# Patient Record
Sex: Female | Born: 1975 | Race: White | Hispanic: No | Marital: Married | Smoking: Never smoker
Health system: Southern US, Community
[De-identification: ages and names within clinical notes are randomized; demographics above are authoritative.]

## PROBLEM LIST (undated history)

## (undated) DIAGNOSIS — E669 Obesity, unspecified: Secondary | ICD-10-CM

## (undated) DIAGNOSIS — N809 Endometriosis, unspecified: Secondary | ICD-10-CM

## (undated) DIAGNOSIS — E559 Vitamin D deficiency, unspecified: Secondary | ICD-10-CM

## (undated) DIAGNOSIS — Z464 Encounter for fitting and adjustment of orthodontic device: Secondary | ICD-10-CM

## (undated) DIAGNOSIS — IMO0001 Reserved for inherently not codable concepts without codable children: Secondary | ICD-10-CM

## (undated) HISTORY — PX: ABDOMINAL HYSTERECTOMY: SHX81

## (undated) HISTORY — DX: Endometriosis, unspecified: N80.9

## (undated) HISTORY — DX: Vitamin D deficiency, unspecified: E55.9

## (undated) HISTORY — DX: Obesity, unspecified: E66.9

---

## 2002-06-30 HISTORY — PX: AUGMENTATION MAMMAPLASTY: SUR837

## 2002-06-30 HISTORY — PX: PLACEMENT OF BREAST IMPLANTS: SHX6334

## 2005-02-06 ENCOUNTER — Emergency Department: Payer: Self-pay | Admitting: Unknown Physician Specialty

## 2005-02-06 ENCOUNTER — Other Ambulatory Visit: Payer: Self-pay

## 2005-04-04 ENCOUNTER — Emergency Department: Payer: Self-pay | Admitting: Emergency Medicine

## 2006-04-27 ENCOUNTER — Emergency Department: Payer: Self-pay | Admitting: Emergency Medicine

## 2006-09-07 ENCOUNTER — Ambulatory Visit: Payer: Self-pay | Admitting: Obstetrics & Gynecology

## 2006-09-10 ENCOUNTER — Ambulatory Visit: Payer: Self-pay | Admitting: Obstetrics & Gynecology

## 2009-09-10 ENCOUNTER — Ambulatory Visit: Payer: Self-pay | Admitting: Family Medicine

## 2010-04-05 ENCOUNTER — Ambulatory Visit: Payer: Self-pay | Admitting: Internal Medicine

## 2013-02-22 ENCOUNTER — Ambulatory Visit: Payer: Self-pay | Admitting: Obstetrics and Gynecology

## 2013-02-22 ENCOUNTER — Encounter: Payer: Self-pay | Admitting: *Deleted

## 2013-02-28 HISTORY — PX: BREAST BIOPSY: SHX20

## 2013-03-09 ENCOUNTER — Encounter: Payer: Self-pay | Admitting: General Surgery

## 2013-03-09 ENCOUNTER — Ambulatory Visit (INDEPENDENT_AMBULATORY_CARE_PROVIDER_SITE_OTHER): Payer: 59 | Admitting: General Surgery

## 2013-03-09 VITALS — BP 124/72 | HR 76 | Resp 12 | Ht 68.0 in | Wt 202.0 lb

## 2013-03-09 DIAGNOSIS — N63 Unspecified lump in unspecified breast: Secondary | ICD-10-CM

## 2013-03-09 NOTE — Progress Notes (Signed)
Patient ID: Linda Jimenez, female   DOB: August 30, 1975, 37 y.o.   MRN: [id number]  Chief Complaint  Patient presents with  . Other    mammgramand left breast mass    HPI Linda Jimenez is a 37 y.o. female who presents for a breast evaluation. The most recent mammogram and ultrasound was done on 02/22/13. Patient does perform regular self breast checks and gets regular mammograms done.  Patient noticed an lump in her left breast about two months ago.She states it is not getting bigger and is not painful. No previous history of breast nodularity. The patient is accompanied by her husband.  HPI  History reviewed. No pertinent past medical history.  Past Surgical History  Procedure Laterality Date  . Abdominal hysterectomy    . Placement of breast implants  2004    Dr. Daphene Calamity    No family history on file.  Social History History  Substance Use Topics  . Smoking status: Never Smoker   . Smokeless tobacco: Never Used  . Alcohol Use: Yes    No Known Allergies  No current outpatient prescriptions on file.   No current facility-administered medications for this visit.    Review of Systems Review of Systems  Constitutional: Negative.   Respiratory: Negative.   Cardiovascular: Negative.     Blood pressure 124/72, pulse 76, resp. rate 12, height 5\' 8"  (1.727 m), weight 202 lb (91.627 kg).  Physical Exam Physical Exam  Constitutional: She is oriented to person, place, and time. She appears well-developed and well-nourished.  Cardiovascular: Normal rate, regular rhythm and normal heart sounds.   Pulmonary/Chest: Breath sounds normal. Right breast exhibits no inverted nipple, no mass, no nipple discharge, no skin change and no tenderness. Left breast exhibits mass. Left breast exhibits no inverted nipple, no nipple discharge, no skin change and no tenderness.  Lymphadenopathy:    She has no cervical adenopathy.    She has no axillary adenopathy.  Neurological: She is alert and  oriented to person, place, and time.  Skin: Skin is warm and dry.    Data Reviewed PCP notes.  Bilateral mammograms dated 02/22/2013 showed retropectoral implants. Partially circumscribed oval masses noted in the left upper inner quadrant corresponding to the palpable findings. Ultrasound examination showed a 1.3 x 2.3 x 2.7 cm mass at the 10:00 position. BI-RAD-3.  Ultrasound at the 11:00 position 8 cm from the nipple confirm the hypoechoic mass measuring 1.5 x 2.3 x 2.6 cm. The patient desired core biopsy to confirm the clinical impression of a fibroadenoma.  10 cc of 0.5% Xylocaine with 0.25% Marcaine with 1-200,000 units of epinephrine was utilized well tolerated. ChloraPrep applied to the skin. Under ultrasound guidance a 14-gauge Finesse device was advanced through the lesion. Multiple core samples were taken at 2 locations. Scant bleeding was noted. The subpectoral implant was in the field of view during needle passage and biopsy, and was more than a centimeter away from the biopsy needle. The skin defect was closed with benzoin and Steri-Strip after placement of a postbiopsy clip. The procedure was well tolerated. Written instructions were provided for wound care.  Assessment    Fibroadenoma of the left breast, asymptomatic.    Plan    If her biopsies benign in the area remains mentally and physically asymptomatic we'll plan for a follow up office ultrasound 6 months.       Linda Jimenez 03/10/2013, 7:59 AM

## 2013-03-09 NOTE — Patient Instructions (Addendum)

## 2013-03-10 ENCOUNTER — Telehealth: Payer: Self-pay | Admitting: *Deleted

## 2013-03-10 ENCOUNTER — Encounter: Payer: Self-pay | Admitting: General Surgery

## 2013-03-10 LAB — PATHOLOGY

## 2013-03-10 NOTE — Telephone Encounter (Signed)
Notified patient as instructed, no cancer (fibroadenoma) per Dr. Lemar Livings, patient pleased. Discussed follow-up appointments, next week with nurse, patient agrees

## 2013-03-15 ENCOUNTER — Encounter: Payer: Self-pay | Admitting: General Surgery

## 2013-03-16 ENCOUNTER — Ambulatory Visit (INDEPENDENT_AMBULATORY_CARE_PROVIDER_SITE_OTHER): Payer: 59 | Admitting: *Deleted

## 2013-03-16 DIAGNOSIS — N63 Unspecified lump in unspecified breast: Secondary | ICD-10-CM

## 2013-03-16 NOTE — Patient Instructions (Addendum)
Heating pad as needed for comfort Continue self breast exams. Call office for any new breast issues or concerns.

## 2013-03-16 NOTE — Progress Notes (Signed)
Patient here today for follow up post left breast biopsy.  No dressing, steristrip in place and aware it may come off in one week.  Minimal bruising noted.   Aware of pathology. Follow up as scheduled.

## 2013-09-05 ENCOUNTER — Encounter: Payer: Self-pay | Admitting: General Surgery

## 2013-09-05 ENCOUNTER — Ambulatory Visit (INDEPENDENT_AMBULATORY_CARE_PROVIDER_SITE_OTHER): Payer: 59 | Admitting: General Surgery

## 2013-09-05 ENCOUNTER — Other Ambulatory Visit: Payer: 59

## 2013-09-05 VITALS — BP 122/68 | HR 77 | Resp 13 | Ht 68.0 in | Wt 203.0 lb

## 2013-09-05 DIAGNOSIS — N63 Unspecified lump in unspecified breast: Secondary | ICD-10-CM | POA: Insufficient documentation

## 2013-09-05 DIAGNOSIS — D249 Benign neoplasm of unspecified breast: Secondary | ICD-10-CM

## 2013-09-05 DIAGNOSIS — D242 Benign neoplasm of left breast: Secondary | ICD-10-CM | POA: Insufficient documentation

## 2013-09-05 NOTE — Patient Instructions (Signed)
Continue self breast exams. Call office for any new breast issues or concerns. 

## 2013-09-05 NOTE — Progress Notes (Signed)
Patient ID: Linda Jimenez, female   DOB: [birth year], 38 y.o.   MRN: [id number]  Chief Complaint  Patient presents with  . Follow-up    breast ultrasound    HPI Linda Jimenez is a 38 y.o. female here today for a left breast ultrasound to follow up from a biopsy done 6 months ago . Patient states she is doing well. She denies any new breast problems.  HPI  No past medical history on file.  Past Surgical History  Procedure Laterality Date  . Abdominal hysterectomy    . Placement of breast implants  2004    Dr. Henreitta Leber    No family history on file.  Social History History  Substance Use Topics  . Smoking status: Never Smoker   . Smokeless tobacco: Never Used  . Alcohol Use: Yes    No Known Allergies  Current Outpatient Prescriptions  Medication Sig Dispense Refill  . Multiple Vitamin (MULTIVITAMINS PO) Take by mouth.       No current facility-administered medications for this visit.    Review of Systems Review of Systems  Constitutional: Negative.   Respiratory: Negative.   Cardiovascular: Negative.     Blood pressure 122/68, pulse 77, resp. rate 13, height 5\' 8"  (1.727 m), weight 203 lb (92.08 kg).  Physical Exam Physical Exam  Constitutional: She is oriented to person, place, and time. She appears well-developed and well-nourished.  Neck: Neck supple.  Cardiovascular: Normal rate, regular rhythm and normal heart sounds.   Pulmonary/Chest: Effort normal and breath sounds normal. Right breast exhibits no inverted nipple, no mass, no nipple discharge, no skin change and no tenderness. Left breast exhibits no inverted nipple, no nipple discharge, no skin change and no tenderness. Mass: soft 2.5 cm area at 11 o'clock about 8 cm from the nipple One.    Lymphadenopathy:    She has no cervical adenopathy.    She has no axillary adenopathy.  Neurological: She is alert and oriented to person, place, and time.    Data Reviewed Left breast biopsy completed March 09, 2013 showed a fibroadenoma. The left breast mass at that time measured 1.8 2 x 2 0.33 x 2.62 cm.  Ultrasound examination of the left breast mass at the 11 o'clock position, 8 cm from the nipple shows a well-circumscribed homogeneous smoothly marginated 1.5 x 2.3 x 2.7 cm mass resting on the pectoralis fascia. No significant interval change.  Assessment    Fibroadenoma left breast.     Plan    The area remains asymptomatic, although the patient appreciates that during her regular self-examination. She has no full pain or tenderness in this area. Options for management were reviewed: 1) continued observation with a follow up ultrasound in 6 months versus 2) referral for cryoablation versus 3) surgical excision. At this time, the patient is comfortable with observation.  Will arrange for a repeat ultrasound in 6 months. If stable will have a final exam with a repeat mammogram in 18 months.        Linda Jimenez 09/05/2013, 7:16 PM

## 2014-03-08 ENCOUNTER — Ambulatory Visit: Payer: Self-pay | Admitting: General Surgery

## 2014-03-29 ENCOUNTER — Ambulatory Visit: Payer: 59

## 2014-03-29 ENCOUNTER — Ambulatory Visit (INDEPENDENT_AMBULATORY_CARE_PROVIDER_SITE_OTHER): Payer: 59 | Admitting: General Surgery

## 2014-03-29 ENCOUNTER — Encounter: Payer: Self-pay | Admitting: General Surgery

## 2014-03-29 VITALS — BP 128/74 | HR 78 | Resp 12 | Ht 68.0 in

## 2014-03-29 DIAGNOSIS — N6320 Unspecified lump in the left breast, unspecified quadrant: Secondary | ICD-10-CM

## 2014-03-29 DIAGNOSIS — D242 Benign neoplasm of left breast: Secondary | ICD-10-CM

## 2014-03-29 DIAGNOSIS — N63 Unspecified lump in unspecified breast: Secondary | ICD-10-CM

## 2014-03-29 DIAGNOSIS — D249 Benign neoplasm of unspecified breast: Secondary | ICD-10-CM

## 2014-03-29 NOTE — Progress Notes (Signed)
Patient ID: Linda Jimenez, female   DOB: 1975/07/17, 38 y.o.   MRN: [id number]  Chief Complaint  Patient presents with  . Follow-up    left breast ultrasound    HPI Linda Jimenez is a 38 y.o. female here today for a left breast ultrasound. Patient states the lump is still there, no pain and the patient has noted no change in size. HPI  No past medical history on file.  Past Surgical History  Procedure Laterality Date  . Abdominal hysterectomy    . Placement of breast implants  2004    Dr. Cathie Hoops  . Breast biopsy Left September 2014    Left breast, 11:00 core biopsy Fibroadenoma    No family history on file.  Social History History  Substance Use Topics  . Smoking status: Never Smoker   . Smokeless tobacco: Never Used  . Alcohol Use: Yes    No Known Allergies  Current Outpatient Prescriptions  Medication Sig Dispense Refill  . Multiple Vitamin (MULTIVITAMINS PO) Take by mouth.       No current facility-administered medications for this visit.    Review of Systems Review of Systems  Constitutional: Negative.   Respiratory: Negative.   Cardiovascular: Negative.     Blood pressure 128/74, pulse 78, resp. rate 12, height 5\' 8"  (1.727 m).  Physical Exam Physical Exam  Constitutional: She is oriented to person, place, and time. She appears well-developed and well-nourished.  Eyes: Conjunctivae are normal. No scleral icterus.  Neck: Neck supple.  Cardiovascular: Normal rate, regular rhythm and normal heart sounds.   Pulmonary/Chest: Effort normal and breath sounds normal. Right breast exhibits no inverted nipple, no mass, no nipple discharge, no skin change and no tenderness. Left breast exhibits mass ( upper outer quadrant , no change since last visit.). Left breast exhibits no inverted nipple, no nipple discharge, no skin change and no tenderness.    Abdominal: Soft. Normal appearance and bowel sounds are normal.  Lymphadenopathy:    She has no cervical  adenopathy.    She has no axillary adenopathy.  Neurological: She is alert and oriented to person, place, and time.  Skin: Skin is warm and dry.    Data Reviewed Left breast ultrasound today shows a well demarcated softly lobulated hypoechoic mass with posterior acoustic enhancement in the 11 o'clock position 6-7 cm from the nipple. This measures 1.73 x 2.39 by 2.91 cm. BI-RAD-2.  In March 2015 this measured 1.5 x 2.3 x 2.8 cm. In September 2014 the area measured 1.8 x 2.3 x 2.6 cm.  Assessment    Fibroadenoma left breast, asymptomatic, minimal interval change.     Plan    The area remains asymptomatic and has shown essentially no change over the last year.  We'll plan for a followup mammogram and clinical exam in one year.      PCP: Hubbard Hartshorn 03/29/2014, 9:46 PM

## 2014-03-29 NOTE — Patient Instructions (Addendum)
Patient to return in one year bilateral screening mammogram .Continue self breast exams. Call office for any new breast issues or concerns.

## 2014-05-01 ENCOUNTER — Encounter: Payer: Self-pay | Admitting: General Surgery

## 2014-10-27 LAB — HEMOGLOBIN A1C: Hgb A1c MFr Bld: 5.5 % (ref 4.0–6.0)

## 2014-10-27 LAB — LIPID PANEL
Cholesterol: 189 mg/dL (ref 0–200)
HDL: 72 mg/dL — AB (ref 35–70)
LDL CALC: 97 mg/dL
Triglycerides: 101 mg/dL (ref 40–160)

## 2014-10-27 LAB — TSH: TSH: 3.43 u[IU]/mL (ref 0.41–5.90)

## 2014-10-27 LAB — BASIC METABOLIC PANEL: Glucose: 93 mg/dL

## 2014-12-01 ENCOUNTER — Encounter: Payer: Self-pay | Admitting: *Deleted

## 2014-12-04 ENCOUNTER — Ambulatory Visit (INDEPENDENT_AMBULATORY_CARE_PROVIDER_SITE_OTHER): Payer: 59 | Admitting: *Deleted

## 2014-12-04 VITALS — BP 150/84 | HR 85 | Ht 68.0 in | Wt 214.6 lb

## 2014-12-04 DIAGNOSIS — E669 Obesity, unspecified: Secondary | ICD-10-CM | POA: Diagnosis not present

## 2014-12-04 MED ORDER — CYANOCOBALAMIN 1000 MCG/ML IJ SOLN
1000.0000 ug | Freq: Once | INTRAMUSCULAR | Status: AC
  Administered 2014-12-04: 1000 ug via INTRAMUSCULAR

## 2014-12-04 NOTE — Progress Notes (Signed)
Patient ID: Linda Jimenez, female   DOB: Jun 25, 1976, 39 y.o.   MRN: [id number] PT IS HERE FOR WT, BP CHECK, B-12 INJ DENIES ANY SIDE EFFECTS FROM PHENTERMINE SHE IS DOING WELL AND HAPPY WITH HER WT LOSS

## 2014-12-25 ENCOUNTER — Encounter: Payer: Self-pay | Admitting: *Deleted

## 2015-01-02 ENCOUNTER — Ambulatory Visit (INDEPENDENT_AMBULATORY_CARE_PROVIDER_SITE_OTHER): Payer: 59 | Admitting: *Deleted

## 2015-01-02 VITALS — BP 151/90 | HR 86 | Ht 68.0 in | Wt 211.6 lb

## 2015-01-02 DIAGNOSIS — E669 Obesity, unspecified: Secondary | ICD-10-CM | POA: Diagnosis not present

## 2015-01-02 MED ORDER — CYANOCOBALAMIN 1000 MCG/ML IJ SOLN
1000.0000 ug | Freq: Once | INTRAMUSCULAR | Status: AC
  Administered 2015-01-02: 1000 ug via INTRAMUSCULAR

## 2015-01-02 NOTE — Progress Notes (Cosign Needed)
Pt is here for wt, bp check, b-12 inj Denies any complaints

## 2015-01-16 ENCOUNTER — Other Ambulatory Visit: Payer: Self-pay | Admitting: Obstetrics and Gynecology

## 2015-01-16 ENCOUNTER — Telehealth: Payer: Self-pay | Admitting: Obstetrics and Gynecology

## 2015-01-16 MED ORDER — CEFDINIR 300 MG PO CAPS
300.0000 mg | ORAL_CAPSULE | Freq: Two times a day (BID) | ORAL | Status: DC
Start: 2015-01-16 — End: 2016-07-29

## 2015-01-16 NOTE — Telephone Encounter (Signed)
PT HAS SINUS INFECTION AND WANTS SOMETHING CALLED IN PT WANTS YOU TO CALL HER.

## 2015-01-16 NOTE — Telephone Encounter (Signed)
pls advise

## 2015-01-17 ENCOUNTER — Other Ambulatory Visit: Payer: Self-pay

## 2015-01-17 DIAGNOSIS — Z1231 Encounter for screening mammogram for malignant neoplasm of breast: Secondary | ICD-10-CM

## 2015-01-30 ENCOUNTER — Ambulatory Visit (INDEPENDENT_AMBULATORY_CARE_PROVIDER_SITE_OTHER): Payer: 59 | Admitting: Obstetrics and Gynecology

## 2015-01-30 VITALS — BP 136/87 | HR 90 | Ht 68.0 in | Wt 212.5 lb

## 2015-01-30 DIAGNOSIS — E669 Obesity, unspecified: Secondary | ICD-10-CM

## 2015-01-30 MED ORDER — CYANOCOBALAMIN 1000 MCG/ML IJ SOLN
1000.0000 ug | Freq: Once | INTRAMUSCULAR | Status: AC
  Administered 2015-01-30: 1000 ug via INTRAMUSCULAR

## 2015-01-30 NOTE — Progress Notes (Signed)
Patient ID: Linda Jimenez, female   DOB: 06/12/1976, 39 y.o.   MRN: [id number] Pt having no adverse side effects of phentermine. Weight gain of 1 lb and pt thinks this is due to not being able drink water at her new job at [employer]. They are not allowed anything to drink except on breaks (if she gets one). Pt would like to know if a note allowing her to have the water at all times.

## 2015-02-27 ENCOUNTER — Encounter: Payer: Self-pay | Admitting: Obstetrics and Gynecology

## 2015-02-27 ENCOUNTER — Telehealth: Payer: Self-pay | Admitting: *Deleted

## 2015-02-27 ENCOUNTER — Ambulatory Visit (INDEPENDENT_AMBULATORY_CARE_PROVIDER_SITE_OTHER): Payer: 59 | Admitting: Obstetrics and Gynecology

## 2015-02-27 VITALS — BP 147/84 | HR 80 | Ht 68.0 in | Wt 216.1 lb

## 2015-02-27 DIAGNOSIS — E669 Obesity, unspecified: Secondary | ICD-10-CM | POA: Diagnosis not present

## 2015-02-27 MED ORDER — PHENTERMINE HCL 37.5 MG PO CAPS: 37.5000 mg | ORAL_CAPSULE | ORAL | Status: DC

## 2015-02-27 NOTE — Telephone Encounter (Signed)
Needs note faxed to work allowing pt to drink water in her area of working [employer]

## 2015-02-27 NOTE — Progress Notes (Signed)
Patient ID: Linda Jimenez, female   DOB: 08-05-1975, 39 y.o.   MRN: [id number]  SUBJECTIVE:  39 y.o. here for follow-up weight loss visit, previously seen 4 weeks ago. Denies any concerns and feels like medication is working well, and has been off of it x 4 weeks. Only complaint is her work will not let her have water at her work station- will give note.  OBJECTIVE:  BP 147/84 mmHg  Pulse 80  Ht 5\' 8"  (1.727 m)  Wt 216 lb 1.6 oz (98.022 kg)  BMI 32.87 kg/m2  LMP  (LMP Unknown)  Body mass index is 32.87 kg/(m^2). Patient appears well. ASSESSMENT:  Obesity- responding well to weight loss plan PLAN:  To continue with current medications. B12 1023mcg/ml injection given RTC in 4 weeks as planned  Gennie Alma, CNM

## 2015-03-12 ENCOUNTER — Ambulatory Visit
Admission: AD | Admit: 2015-03-12 | Discharge: 2015-03-12 | Disposition: A | Discharge status: Home / Self Care | Payer: Managed Care, Other (non HMO) | Source: Ambulatory Visit | Attending: Family Medicine | Admitting: Family Medicine

## 2015-03-12 ENCOUNTER — Telehealth: Payer: Self-pay | Admitting: Obstetrics and Gynecology

## 2015-03-12 ENCOUNTER — Ambulatory Visit
Admission: EM | Admit: 2015-03-12 | Discharge: 2015-03-12 | Disposition: A | Discharge status: Home / Self Care | Payer: Managed Care, Other (non HMO) | Attending: Family Medicine | Admitting: Family Medicine

## 2015-03-12 DIAGNOSIS — R1030 Lower abdominal pain, unspecified: Secondary | ICD-10-CM | POA: Diagnosis not present

## 2015-03-12 DIAGNOSIS — N390 Urinary tract infection, site not specified: Secondary | ICD-10-CM | POA: Diagnosis not present

## 2015-03-12 DIAGNOSIS — K7689 Other specified diseases of liver: Secondary | ICD-10-CM | POA: Diagnosis not present

## 2015-03-12 DIAGNOSIS — R1031 Right lower quadrant pain: Secondary | ICD-10-CM

## 2015-03-12 LAB — URINALYSIS COMPLETE WITH MICROSCOPIC (ARMC ONLY)
GLUCOSE, UA: NEGATIVE mg/dL
Ketones, ur: NEGATIVE mg/dL
LEUKOCYTES UA: NEGATIVE
Nitrite: NEGATIVE
Specific Gravity, Urine: >1.03 — ABNORMAL HIGH (ref 1.005–1.030)
WBC, UA: NONE SEEN WBC/hpf (ref <3)
pH: 5.5 (ref 5.0–8.0)

## 2015-03-12 LAB — CBC WITH DIFFERENTIAL/PLATELET
Basophils Absolute: 0.1 10*3/uL (ref 0–0.1)
Basophils Relative: 1 %
Eosinophils Absolute: 0 10*3/uL (ref 0–0.7)
Eosinophils Relative: 0 %
HEMATOCRIT: 38.4 % (ref 35.0–47.0)
Hemoglobin: 12.7 g/dL (ref 12.0–16.0)
LYMPHS PCT: 22 %
Lymphs Abs: 2.3 10*3/uL (ref 1.0–3.6)
MCH: 29 pg (ref 26.0–34.0)
MCHC: 33.2 g/dL (ref 32.0–36.0)
MCV: 87.2 fL (ref 80.0–100.0)
MONOS PCT: 5 %
Monocytes Absolute: 0.5 10*3/uL (ref 0.2–0.9)
NEUTROS ABS: 7.3 10*3/uL — AB (ref 1.4–6.5)
Neutrophils Relative %: 72 %
Platelets: 298 10*3/uL (ref 150–440)
RBC: 4.4 MIL/uL (ref 3.80–5.20)
RDW: 14.5 % (ref 11.5–14.5)
WBC: 10.2 10*3/uL (ref 3.6–11.0)

## 2015-03-12 LAB — COMPREHENSIVE METABOLIC PANEL
ALK PHOS: 102 U/L (ref 38–126)
ALT: 14 U/L (ref 14–54)
ANION GAP: 8 (ref 5–15)
AST: 15 U/L (ref 15–41)
Albumin: 4.4 g/dL (ref 3.5–5.0)
BILIRUBIN TOTAL: 0.5 mg/dL (ref 0.3–1.2)
BUN: 14 mg/dL (ref 6–20)
CALCIUM: 9 mg/dL (ref 8.9–10.3)
CO2: 30 mmol/L (ref 22–32)
Chloride: 98 mmol/L — ABNORMAL LOW (ref 101–111)
Creatinine, Ser: 0.6 mg/dL (ref 0.44–1.00)
GFR calc non Af Amer: >60 mL/min (ref >60)
GLUCOSE: 107 mg/dL — AB (ref 65–99)
Potassium: 3.7 mmol/L (ref 3.5–5.1)
Sodium: 136 mmol/L (ref 135–145)
TOTAL PROTEIN: 7.9 g/dL (ref 6.5–8.1)

## 2015-03-12 LAB — LIPASE, BLOOD: Lipase: 18 U/L — ABNORMAL LOW (ref 22–51)

## 2015-03-12 MED ORDER — METRONIDAZOLE 500 MG PO TABS: 500.0000 mg | ORAL_TABLET | Freq: Two times a day (BID) | ORAL | Status: DC

## 2015-03-12 MED ORDER — ONDANSETRON 4 MG PO TBDP
4.0000 mg | ORAL_TABLET | Freq: Once | ORAL | Status: AC
  Administered 2015-03-12: 4 mg via ORAL

## 2015-03-12 MED ORDER — IOHEXOL 240 MG/ML SOLN
50.0000 mL | Freq: Once | INTRAMUSCULAR | Status: DC | PRN
Start: 2015-03-12 — End: 2015-03-13

## 2015-03-12 MED ORDER — IOHEXOL 300 MG/ML  SOLN
100.0000 mL | Freq: Once | INTRAMUSCULAR | Status: AC | PRN
  Administered 2015-03-12: 100 mL via INTRAVENOUS

## 2015-03-12 MED ORDER — ONDANSETRON 4 MG PO TBDP: 4.0000 mg | ORAL_TABLET | Freq: Three times a day (TID) | ORAL | Status: DC | PRN

## 2015-03-12 MED ORDER — CIPROFLOXACIN HCL 500 MG PO TABS: 500.0000 mg | ORAL_TABLET | Freq: Two times a day (BID) | ORAL | Status: AC

## 2015-03-12 MED ORDER — OXYCODONE-ACETAMINOPHEN 5-325 MG PO TABS: 1.0000 | ORAL_TABLET | Freq: Three times a day (TID) | ORAL | Status: DC | PRN

## 2015-03-12 NOTE — Telephone Encounter (Signed)
PT CALLED AND STATED SHE HAD TO LEAVE WORK DUE TO SEVERE ABD PAIN, SHE WANTED TO BE SEEN TODAY, I TOLD HER THAT THERE ARE NO PROVIDERS IN THE OFFICE ON MONDAYS, I ASKED IF SHE WANTED AN APPT FOR THIS WEEK AND SHE STATED SHE HAD TO WORK.

## 2015-03-12 NOTE — Discharge Instructions (Signed)
As discussed, go directly to [facility] for the CT scan of your abdomen. Do not eat or drink. Then stay there until further instructed.   Abdominal Pain Many things can cause abdominal pain. Usually, abdominal pain is not caused by a disease and will improve without treatment. It can often be observed and treated at home. Your health care provider will do a physical exam and possibly order blood tests and X-rays to help determine the seriousness of your pain. However, in many cases, more time must pass before a clear cause of the pain can be found. Before that point, your health care provider may not know if you need more testing or further treatment. HOME CARE INSTRUCTIONS  Monitor your abdominal pain for any changes. The following actions may help to alleviate any discomfort you are experiencing:  Only take over-the-counter or prescription medicines as directed by your health care provider.  Do not take laxatives unless directed to do so by your health care provider.  Try a clear liquid diet (broth, tea, or water) as directed by your health care provider. Slowly move to a bland diet as tolerated. SEEK MEDICAL CARE IF:  You have unexplained abdominal pain.  You have abdominal pain associated with nausea or diarrhea.  You have pain when you urinate or have a bowel movement.  You experience abdominal pain that wakes you in the night.  You have abdominal pain that is worsened or improved by eating food.  You have abdominal pain that is worsened with eating fatty foods.  You have a fever. SEEK IMMEDIATE MEDICAL CARE IF:   Your pain does not go away within 2 hours.  You keep throwing up (vomiting).  Your pain is felt only in portions of the abdomen, such as the right side or the left lower portion of the abdomen.  You pass bloody or black tarry stools. MAKE SURE YOU:  Understand these instructions.   Will watch your condition.   Will get help right away  if you are not doing well or get worse.  Document Released: 03/26/2005 Document Revised: 06/21/2013 Document Reviewed: 02/23/2013 [facility] Patient Information 2015 [place], [place]. This information is not intended to replace advice given to you by your health care provider. Make sure you discuss any questions you have with your health care provider.

## 2015-03-12 NOTE — ED Notes (Signed)
Pt states "I have mid-stomach pain that started last night about midnight. A few lose stools but not bad. I am urinating ok, just really bad stomach pain."

## 2015-03-12 NOTE — ED Provider Notes (Signed)
[facility] Department Provider Note  ____________________________________________  Time seen: Approximately 6:41 PM  I have reviewed the triage vital signs and the nursing notes.   HISTORY  Chief Complaint Abdominal Pain   HPI Linda Jimenez is a 39 y.o. female presents for complaints of abdominal pain x one day. Reports abdominal pain mid abdomen but all over. States pain is constant aching but with intermittent sharp and stabbing pain. States movement makes worse. Denies relation to food or changes with food. Denies fall, injury, or trauma. Reports drank fluids well today, no appetite and has not eaten today. States current abdominal pain is 7/10. Denies vaginal pain, vaginal discharge or odor, urinary changes or dysuria. States x one loose stool this am, denies color changes, denies blood or dark stools. States nausea today but no vomiting.     Past Medical History  Diagnosis Date  . Obesity   . Endometriosis   . Vitamin D deficiency disease     Patient Active Problem List   Diagnosis Date Noted  . 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 Abdominal hysterectomy    . Placement of breast implants  2004    Dr. Cathie Hoops  . Breast biopsy Left September 2014    Left breast, 11:00 core biopsy Fibroadenoma    Current Outpatient Rx  Name  Route  Sig  Dispense  Refill  . phentermine 37.5 MG capsule   Oral   Take 1 capsule (37.5 mg total) by mouth every morning.   30 capsule   2              . Multiple Vitamin (MULTIVITAMINS PO)   Oral   Take by mouth.           Allergies Review of patient's allergies indicates no known allergies.  No family history on file.  Social History Social History  Substance Use Topics  . Smoking status: Never Smoker   . Smokeless tobacco: Never Used  . Alcohol Use: Yes     Comment: occas    Review of Systems Constitutional: No fever/chills Eyes:  No visual changes. ENT: No sore throat. Cardiovascular: Denies chest pain. Respiratory: Denies shortness of breath. Gastrointestinal: positive abdominal pain.  Positive nausea, no vomiting.  No diarrhea.  No constipation. Genitourinary: Negative for dysuria. Musculoskeletal: Negative for back pain. Skin: Negative for rash. Neurological: Negative for headaches, focal weakness or numbness.  10-point ROS otherwise negative.  ____________________________________________   PHYSICAL EXAM:  VITAL SIGNS: ED Triage Vitals  Enc Vitals Group     BP 03/12/15 1740 156/84 mmHg     Pulse Rate 03/12/15 1740 83     Resp 03/12/15 1740 16     Temp 03/12/15 1740 98.2 F (36.8 C)     Temp src --      SpO2 --      Weight 03/12/15 1740 214 lb (97.07 kg)     Height 03/12/15 1740 5\' 8"  (1.727 m)     Head Cir --      Peak Flow --      Pain Score 03/12/15 1743 6     Pain Loc --      Pain Edu? --      Excl. in [place]? --     Constitutional: Alert and oriented. Well appearing and in no acute distress. Eyes: Conjunctivae are normal. PERRL. EOMI. Head: Atraumatic.  Nose: No congestion/rhinnorhea.  Mouth/Throat: Mucous membranes are moist.  Oropharynx non-erythematous. Neck: No stridor.  No cervical spine tenderness to palpation. Hematological/Lymphatic/Immunilogical: No cervical lymphadenopathy. Cardiovascular: Normal rate, regular rhythm. Grossly normal heart sounds.  Good peripheral circulation. Respiratory: Normal respiratory effort.  No retractions. Lungs CTAB. Gastrointestinal: Soft. Mild TTP umbilical, LUQ and RUQ. Mild to mod LLQ TTP, mod to severe RLQ TTP at McBurney's point. No McMurphy sign.  No distention. Normal Bowel sounds.  No abdominal bruits. No CVA tenderness. Musculoskeletal: No lower or upper extremity tenderness nor edema.  No joint effusions. Bilateral pedal pulses equal and easily palpated.  Neurologic:  Normal speech and language. No gross focal neurologic deficits are  appreciated. No gait instability. Skin:  Skin is warm, dry and intact. No rash noted. Psychiatric: Mood and affect are normal. Speech and behavior are normal.  ____________________________________________   LABS (all labs ordered are listed, but only abnormal results are displayed)  Labs Reviewed  URINALYSIS COMPLETEWITH MICROSCOPIC ([place]) - Abnormal; Notable for the following:    Bilirubin Urine 1+ (*)    Specific Gravity, Urine >1.030 (*)    Hgb urine dipstick 2+ (*)    Protein, ur TRACE (*)    Bacteria, UA FEW (*)    Squamous Epithelial / LPF 6-30 (*)    All other components within normal limits  CBC WITH DIFFERENTIAL/PLATELET - Abnormal; Notable for the following:    Neutro Abs 7.3 (*)    All other components within normal limits  COMPREHENSIVE METABOLIC PANEL - Abnormal; Notable for the following:    Chloride 98 (*)    Glucose, Bld 107 (*)    All other components within normal limits  LIPASE, BLOOD - Abnormal; Notable for the following:    Lipase 18 (*)    All other components within normal limits  URINE CULTURE   ____________________________________________  RADIOLOGY CLINICAL DATA: Lower abdominal pain right greater than left beginning last night. Diaphoresis with nausea.  EXAM: CT ABDOMEN AND PELVIS WITH CONTRAST  TECHNIQUE: Multidetector CT imaging of the abdomen and pelvis was performed using the standard protocol following bolus administration of intravenous contrast.  CONTRAST: 128mL OMNIPAQUE IOHEXOL 300 MG/ML SOLN  COMPARISON: None.  FINDINGS: Lung bases are normal. Bilateral breast implants intact.  Abdominal images demonstrate the gallbladder to be somewhat contracted. There is a 1.3 cm hypodensity over the dome of the right lobe of the liver likely a hemangioma.  The spleen, pancreas and adrenal glands are normal. Kidneys normal in size without hydronephrosis or nephrolithiasis. Sub cm hypodensity over the mid pole cortex of  the left kidney too small to characterize but likely a cyst. Ureters are within normal.  Appendix is normal. Colon is within normal.  There is wall thickening of the terminal ileum continuous with multiple ileal loops with mild fold thickening. There is mild adjacent free fluid in the right lower quadrant and pelvis. No evidence of obstruction or perforation. Few small nonspecific mesenteric lymph nodes in the right lower quadrant.  Vascular structures are within normal.  Pelvic images demonstrate the bladder and rectum to be within normal. There is surgical absence of the uterus ovaries are high in location but within normal. Remaining bones and soft tissues are within normal.  IMPRESSION: Wall thickening involving the terminal ileum and adjacent ileal loops with mild adjacent free. No evidence of obstruction or perforation. Findings may be due to infectious enteritis versus inflammatory process such as Crohn's disease.  1.3 cm hypodensity over the right lobe of the liver likely a hemangioma.   Electronically Signed  By: Marin Olp M.D.  On: 03/12/2015 20:58     INITIAL IMPRESSION / ASSESSMENT AND PLAN / ED COURSE  Pertinent labs & imaging results that were available during my care of the patient were reviewed by me and considered in my medical decision making (see chart for details).  Well appearing patient. No acute distress. Presents for complaints of x one day of abdominal pain. Pt with generalized abdominal pain, worse in RLQ. Afebrile.Some nausea, no vomiting. Continues to drink fluids well.  Labs reviewed. WBC 10.2. Urinalysis positive for few bacteria but concern that UTI is not cause of pain. Need to further evaluate. Unable to perform CT at this facility at this time.   1850: Will order Ct abdomen and pelvis with contrast and patient will have performed outpatient. Patient mother now at bedside to transport patient to hospital to have CT performed.  Patient to remain NPO and go directly for CT. Patient alert and oriented with decisional capacity. Will proceed with CT abdomen. Patient verbalized understanding and agreed to plan.Also discussed with patient post CT and determining of CT findings that patient is to follow-up closely with her primary care physician this week. Discussed follow-up with her primary care care physician this week and 2-3 days. Patient verbalized understanding and agreed to plan. Discussed patient and planning care with Dr. Zenda Alpers who agrees with plan. ____________________________________________   FINAL CLINICAL IMPRESSION(S) / ED DIAGNOSES  Final diagnoses:  Right lower quadrant abdominal pain  UTI (lower urinary tract infection)       Marylene Land, NP 03/12/15 1858   ADDENDUM: 03/12/2015  2140: Spoke with [facility] radiology tech with report. Ct abdomen report also reviewed. Ct abdomen findings per radiology may be due to infectious enteritis vs inflammatory process such as Crohn's disease. Also 1.3 cm hypodensity over the right lobe of the liver likely a hemangioma.  Discussed patient and planning care with Dr. Cornelius Moras who also reviewed the CT results. Discussed plan care with Dr. Zenda Alpers and Dr. Zenda Alpers who agrees with plan.  Discussed these findings with the patient including finding of right lobe of liver hypodensity. Suspect infectious enteritis and will treat patient with oral ciprofloxacin and flagyl 500mg  BID x 10 days. PRN zofran and percocet. Discussed follow-up with primary care physician also in regards to incidental findings of hypodensity in liver. Discussed strict follow up and return parameters with patient. Patient to follow up with PCP this week in 2-3 days for close follow up. PCP Melody Trudee Kuster. Discussed with patient return to urgent care or proceed to ER for increased pain, fever, inability to tolerate food or fluids, and worsening concerns. Patient verbalized understanding and agreed to  plan.  Marylene Land, NP 03/12/15 2224  Marylene Land, NP 03/12/15 2245

## 2015-03-12 NOTE — Telephone Encounter (Signed)
Called pt she states her pain is under her rib cage, dead center, advised pt it could be her gallbladder advised her to be seen urgent care

## 2015-03-12 NOTE — ED Notes (Signed)
Pt states "moving makes the pain worse."

## 2015-03-14 LAB — URINE CULTURE: SPECIAL REQUESTS: NORMAL

## 2015-03-20 ENCOUNTER — Telehealth: Payer: Self-pay | Admitting: Obstetrics and Gynecology

## 2015-03-20 NOTE — Telephone Encounter (Signed)
Left detailed message pt needs to drop off urine to be sure

## 2015-03-20 NOTE — Telephone Encounter (Signed)
She would need to come in and drop off urine to be sure, especially since the cipro should take care of a typical UTI

## 2015-03-20 NOTE — Telephone Encounter (Signed)
WAS GIVEN AN ANTIBIOTIC WHEN SHE WENT TO THE HOSPITAL AND SHE BASICALLY HAD AN INFECTION FROM THE WAIST DOWN BUT KNOW SHE THINKS SHE HAS A UTI, THEY GAVE HER CIPRO AND FLAGYL, AND SHE IS STILL TAKING BOTH OF THEM, AND SHE IS STARTING TO GET THE FEELING OF URINATING AND WANTED TO GET SOMETHING

## 2015-03-20 NOTE — Telephone Encounter (Signed)
pls advise

## 2015-03-26 ENCOUNTER — Ambulatory Visit: Payer: Self-pay | Admitting: General Surgery

## 2015-03-27 ENCOUNTER — Ambulatory Visit (INDEPENDENT_AMBULATORY_CARE_PROVIDER_SITE_OTHER): Payer: Managed Care, Other (non HMO) | Admitting: Obstetrics and Gynecology

## 2015-03-27 ENCOUNTER — Encounter: Payer: Self-pay | Admitting: Obstetrics and Gynecology

## 2015-03-27 VITALS — BP 146/85 | HR 88 | Wt 215.5 lb

## 2015-03-27 DIAGNOSIS — E669 Obesity, unspecified: Secondary | ICD-10-CM

## 2015-03-27 MED ORDER — CYANOCOBALAMIN 1000 MCG/ML IJ SOLN
1000.0000 ug | Freq: Once | INTRAMUSCULAR | Status: AC
  Administered 2015-03-27: 1000 ug via INTRAMUSCULAR

## 2015-03-27 NOTE — Progress Notes (Cosign Needed)
Pt is here for wt, bp check,b-12 inj She is doing well, denies any s/e  01/30/15 wt-212lb 03/27/15 wt-215.5lb

## 2015-04-01 ENCOUNTER — Ambulatory Visit
Admission: EM | Admit: 2015-04-01 | Discharge: 2015-04-01 | Disposition: A | Discharge status: Home / Self Care | Payer: Managed Care, Other (non HMO) | Attending: Family Medicine | Admitting: Family Medicine

## 2015-04-01 ENCOUNTER — Encounter: Payer: Self-pay | Admitting: Gynecology

## 2015-04-01 DIAGNOSIS — R1084 Generalized abdominal pain: Secondary | ICD-10-CM | POA: Diagnosis not present

## 2015-04-01 MED ORDER — DICYCLOMINE HCL 20 MG PO TABS: 20.0000 mg | ORAL_TABLET | Freq: Two times a day (BID) | ORAL | Status: DC

## 2015-04-01 MED ORDER — ONDANSETRON HCL 4 MG PO TABS: 4.0000 mg | ORAL_TABLET | Freq: Four times a day (QID) | ORAL | Status: DC

## 2015-04-01 NOTE — Discharge Instructions (Signed)
Please contact your primary care provider in the morning for a visit to help coordinate your care. They can review your recent CT and labs and make additional  recommendations. I have attached medication Rx for nausea and also for bowel cramps-  Please report to the ER of your choice should you develop a return/increase of your symptoms  Abdominal Pain Many things can cause abdominal pain. Usually, abdominal pain is not caused by a disease and will improve without treatment. It can often be observed and treated at home. Your health care provider will do a physical exam and possibly order blood tests and X-rays to help determine the seriousness of your pain. However, in many cases, more time must pass before a clear cause of the pain can be found. Before that point, your health care provider may not know if you need more testing or further treatment. HOME CARE INSTRUCTIONS  Monitor your abdominal pain for any changes. The following actions may help to alleviate any discomfort you are experiencing:  Only take over-the-counter or prescription medicines as directed by your health care provider.  Do not take laxatives unless directed to do so by your health care provider.  Try a clear liquid diet (broth, tea, or water) as directed by your health care provider. Slowly move to a bland diet as tolerated. SEEK MEDICAL CARE IF:  You have unexplained abdominal pain.  You have abdominal pain associated with nausea or diarrhea.  You have pain when you urinate or have a bowel movement.  You experience abdominal pain that wakes you in the night.  You have abdominal pain that is worsened or improved by eating food.  You have abdominal pain that is worsened with eating fatty foods.  You have a fever. SEEK IMMEDIATE MEDICAL CARE IF:   Your pain does not go away within 2 hours.  You keep throwing up (vomiting).  Your pain is felt only in portions of the abdomen, such as the right side or the left  lower portion of the abdomen.  You pass bloody or black tarry stools. MAKE SURE YOU:  Understand these instructions.   Will watch your condition.   Will get help right away if you are not doing well or get worse.  Document Released: 03/26/2005 Document Revised: 06/21/2013 Document Reviewed: 02/23/2013 [facility] Patient Information 2015 [place], [place]. This information is not intended to replace advice given to you by your health care provider. Make sure you discuss any questions you have with your health care provider. Diarrhea Diarrhea is frequent loose and watery bowel movements. It can cause you to feel weak and dehydrated. Dehydration can cause you to become tired and thirsty, have a dry mouth, and have decreased urination that often is dark yellow. Diarrhea is a sign of another problem, most often an infection that will not last long. In most cases, diarrhea typically lasts 2-3 days. However, it can last longer if it is a sign of something more serious. It is important to treat your diarrhea as directed by your caregiver to lessen or prevent future episodes of diarrhea. CAUSES  Some common causes include:  Gastrointestinal infections caused by viruses, bacteria, or parasites.  Food poisoning or food allergies.  Certain medicines, such as antibiotics, chemotherapy, and laxatives.  Artificial sweeteners and fructose.  Digestive disorders. HOME CARE INSTRUCTIONS  Ensure adequate fluid intake (hydration): Have 1 cup (8 oz) of fluid for each diarrhea episode. Avoid fluids that contain simple sugars or sports drinks, fruit juices, whole milk products,  and sodas. Your urine should be clear or pale yellow if you are drinking enough fluids. Hydrate with an oral rehydration solution that you can purchase at pharmacies, retail stores, and online. You can prepare an oral rehydration solution at home by mixing the following ingredients together:   - tsp table salt.   tsp baking soda.    tsp salt substitute containing potassium chloride.  1  tablespoons sugar.  1 L (34 oz) of water.  Certain foods and beverages may increase the speed at which food moves through the gastrointestinal (GI) tract. These foods and beverages should be avoided and include:  Caffeinated and alcoholic beverages.  High-fiber foods, such as raw fruits and vegetables, nuts, seeds, and whole grain breads and cereals.  Foods and beverages sweetened with sugar alcohols, such as xylitol, sorbitol, and mannitol.  Some foods may be well tolerated and may help thicken stool including:  Starchy foods, such as rice, toast, pasta, low-sugar cereal, oatmeal, grits, baked potatoes, crackers, and bagels.  Bananas.  Applesauce.  Add probiotic-rich foods to help increase healthy bacteria in the GI tract, such as yogurt and fermented milk products.  Wash your hands well after each diarrhea episode.  Only take over-the-counter or prescription medicines as directed by your caregiver.  Take a warm bath to relieve any burning or pain from frequent diarrhea episodes. SEEK IMMEDIATE MEDICAL CARE IF:   You are unable to keep fluids down.  You have persistent vomiting.  You have blood in your stool, or your stools are black and tarry.  You do not urinate in 6-8 hours, or there is only a small amount of very dark urine.  You have abdominal pain that increases or localizes.  You have weakness, dizziness, confusion, or light-headedness.  You have a severe headache.  Your diarrhea gets worse or does not get better.  You have a fever or persistent symptoms for more than 2-3 days.  You have a fever and your symptoms suddenly get worse. MAKE SURE YOU:   Understand these instructions.  Will watch your condition.  Will get help right away if you are not doing well or get worse. Document Released: 06/06/2002 Document Revised: 10/31/2013 Document Reviewed: 02/22/2012 [facility] Patient Information 2015  [place], [place]. This information is not intended to replace advice given to you by your health care provider. Make sure you discuss any questions you have with your health care provider.

## 2015-04-01 NOTE — ED Provider Notes (Signed)
CSN: [id number]     Arrival date & time 04/01/15  9233 History   First MD Initiated Contact with Patient 04/01/15 [phone]     Chief Complaint  Patient presents with  . Abdominal Pain   (Consider location/radiation/quality/duration/timing/severity/associated sxs/prior Treatment) HPI  39 yo F with husband presents reporting that she wants an U/S because a girlfriend thinks she has GB disease. Was seen on 03/12/2015 with generalized abdominal pain some increased focus RLQ. Labs were  Non-diagnostic but CT revealed inflammation /thickened terminal illeum r/o infectious enteritis vs Crohn's. She was given Cipro , Flagyl, Zofran and Percocet.  It was stressed that she should see her PCP in next 2-3 days  And keep in careful follow up-and to return/ER with exacerbation. There was reference to Gennie Alma as  PCP  but she is actually a nurse in [place]. The patient has not seen a PCP.  Nuriya reports she felt better while on the antibiotics but recently re-developed abdominal discomfort. Had  A few hours of mid epigastric pain yesterday with some nausea. She works in a [employer] and had 3-4 slices of plain [place]. Later had episode of diarrhea. No additional diarrhea, no specific pain today. Wants an U/S done now.  She insists that she was never told to seek PCP follow up and did not. With husband present the  very clear discharge instructions from previous visit were read and reviewed. She has apparently never established with PCP and uses her friend in [place] as medical reference.    Past Medical History  Diagnosis Date  . Obesity   . Endometriosis   . Vitamin D deficiency disease    Past Surgical History  Procedure Laterality Date  . Abdominal hysterectomy    . Placement of breast implants  2004    Dr. Cathie Hoops  . 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 Social History  Substance Use Topics  . Smoking status: Never Smoker   .  Smokeless tobacco: Never Used  . Alcohol Use: Yes     Comment: occas   OB History    Gravida Para Term Preterm AB TAB SAB Ectopic Multiple Living   2 2        2       Obstetric Comments   1st Menstrual Cycle:  10 1st Pregnancy:  21     Review of Systems Constitutional: No fever.  Eyes: No visual changes. ENT:No sore throat. Cardiovascular:Negative for chest pain/palpitations Respiratory: Negative for shortness of breath Gastrointestinal: No abdominal pain at present. No nausea,vomiting. Diarrhea x 1 yesterday Genitourinary: Negative for dysuria. Normal urination. Musculoskeletal: Negative for back pain. FROM extremities without pain Skin: Negative for rash Neurological: Negative for headache, focal weakness or numbness  Allergies  Review of patient's allergies indicates no known allergies.  Home Medications   Prior to Admission medications   Medication Sig Start Date End Date Taking? Authorizing Provider  cefdinir (OMNICEF) 300 MG capsule Take 1 capsule (300 mg total) by mouth 2 (two) times daily. 01/16/15  Yes Melody Valene Bors, CNM  metroNIDAZOLE (FLAGYL) 500 MG tablet Take 1 tablet (500 mg total) by mouth 2 (two) times daily. 03/12/15  Yes Marylene Land, NP  Multiple Vitamin (MULTIVITAMINS PO) Take by mouth.   Yes Historical Provider, MD  ondansetron (ZOFRAN ODT) 4 MG disintegrating tablet Take 1 tablet (4 mg total) by mouth every 8 (eight) hours as needed for nausea or vomiting. 03/12/15  Yes Marylene Land, NP  oxyCODONE-acetaminophen (ROXICET) 5-325 MG per tablet Take 1 tablet by mouth every 8 (eight) hours as needed for moderate pain or severe pain (Do not drive or operate heavy machinery while taking as can cause drowsiness.). 03/12/15  Yes Marylene Land, NP  phentermine 37.5 MG capsule Take 1 capsule (37.5 mg total) by mouth every morning. 02/27/15  Yes Melody Valene Bors, CNM  dicyclomine (BENTYL) 20 MG tablet Take 1 tablet (20 mg total) by mouth 2 (two) times daily. As needed for  cramping 04/01/15   Jan Fireman, PA-C  ondansetron (ZOFRAN) 4 MG tablet Take 1 tablet (4 mg total) by mouth every 6 (six) hours. If needed for nausea  ( 1-2 tablets ) 04/01/15   Jan Fireman, PA-C   Meds Ordered and Administered this Visit  Medications - No data to display  BP 119/78 mmHg  Pulse 79  Temp(Src) 97.6 F (36.4 C) (Oral)  Resp 18  Ht 5\' 8"  (1.727 m)  Wt 210 lb (95.255 kg)  BMI 31.94 kg/m2  SpO2 100%  LMP  (LMP Unknown) No data found.   Physical Exam Husband present in the room throughout the consultation and exam  Constitutional: Alert and oriented, well appearing, VS are noted,  General : No acute distress; gets teary and upset or angry intermittently Head:normocephalic, atraumatic,  Eyes: conjugate gaze,negative conjunctiva,  Ears:Grossly normal hearing,  Mouth/throat :Mucous membranes moist Neck :  supple  Heart: Normal rate, regular rhythm  79 Lung:    Normal respiratory effort and rate , no distress  18 Back:    No CVAT, no spinal tenderness noted Abd :    soft, non-tender including RLQ-there is very mild discomfort  mid-epigastric with palpation. Normal to quiet  bowel sounds present, no guarding,rebound, organomegaly appreciated.  Liver edge non-palp, no obvious splenomegaly; no bladder tenderness. MSK:   nontender, normal ROM all extremities; ambulatory in unit, on and off table without assistance Neuro:Face symmetric, EOMI, PERRLA,tongue midline. Grossly intact; good attention and recall,normal gait, normal speech and language Skin:  Warm,dry,intact Psych: mood and affect -very irritated that we don't have U/S here on Sunday, became angry and upset.  When presenting her history from yesterday with single episode nausea and mid-epigastric discomfort . Crying.   ED Course  Procedures (including critical care time)  Labs Review Labs Reviewed - No data to display  Imaging Review No results found.  Husband  Present. Discussed that [place] not available to [place]  but she could go to ER for evaluation if desired.  He did not want to "have to wait up there".He was calm when she was expressive,       Reviewed chart and brought CT and lab information to their attention. GB was contracted no stones and labs were negative- not supportive of GB attack.  Reviewed their CT bowel findings with concern for infectious process or possibly Crohns.   Clearly reviewed her need to establish with PCP as Hub of her care and let referrals evolve from there. She may need colonoscopy or other  Studies. She is afebrile with minimal findings today.She was offered Bentyl for cramping and Zofran for nausea- both of which she initially refused and later accepted. D/C home for f/u in ER of there is an acute exacerbation of pain, fever, diarrhea or other concerns.  Dr. Zenda Alpers was consulted during the course of this visit.   MDM   1. Generalized abdominal pain    Diagnosis and treatment discussed.  Questions fielded, expectations and recommendations reviewed.  Patient expresses understanding. Will return to [facility] with questions, concern or exacerbation.   Discharge Medication List as of 04/01/2015  9:44 AM    START taking these medications   Details  dicyclomine (BENTYL) 20 MG tablet Take 1 tablet (20 mg total) by mouth 2 (two) times daily. As needed for cramping, Starting 04/01/2015, Until Discontinued, Print    ondansetron (ZOFRAN) 4 MG tablet Take 1 tablet (4 mg total) by mouth every 6 (six) hours. If needed for nausea  ( 1-2 tablets ), Starting 04/01/2015, Until Discontinued, Print          Jan Fireman, PA-C 04/03/15 1633

## 2015-04-01 NOTE — ED Notes (Signed)
Patient stated was seen x 2 week ago for abdominal pain. Pt. Stated was seen by her OB doctor and they recommend a ultrasound to check her gallbladder.

## 2015-04-02 ENCOUNTER — Telehealth: Payer: Self-pay | Admitting: Obstetrics and Gynecology

## 2015-04-02 NOTE — Telephone Encounter (Signed)
Medr[facility] called and said you tod her to call Amy for a GI referral. Amy said you would have to put the referral in.

## 2015-04-03 ENCOUNTER — Other Ambulatory Visit: Payer: Self-pay | Admitting: Obstetrics and Gynecology

## 2015-04-03 DIAGNOSIS — R1010 Upper abdominal pain, unspecified: Secondary | ICD-10-CM

## 2015-04-03 NOTE — Telephone Encounter (Signed)
Please see where she desires to go and let her know when referral is made

## 2015-04-05 ENCOUNTER — Telehealth: Payer: Self-pay | Admitting: Obstetrics and Gynecology

## 2015-04-05 NOTE — Telephone Encounter (Signed)
Linda Jimenez IS NOT HAPPY THAT SHE COULD NOT GET IN TO DR. Rolin Barry OFFICE FOR 1 MO 1/2. SHE WANTS TO BE REFERRED SOMEWHERE ELSE. SHE WANTS YOU TO CALL HER.

## 2015-04-10 NOTE — Telephone Encounter (Signed)
Please see where she like to be referred, my only other suggestion would be one of the docs in [place], but still may have to wait since she will be a new patient.

## 2015-04-11 NOTE — Telephone Encounter (Signed)
Yes, it should not adversely affect it

## 2015-04-11 NOTE — Telephone Encounter (Signed)
Notified pt she voiced understanding 

## 2015-04-11 NOTE — Telephone Encounter (Signed)
Called pt states she may have chrons disease, she wants to know if she can continue on phentermine and b-12 ??

## 2015-04-30 ENCOUNTER — Ambulatory Visit (INDEPENDENT_AMBULATORY_CARE_PROVIDER_SITE_OTHER): Payer: Managed Care, Other (non HMO) | Admitting: Obstetrics and Gynecology

## 2015-04-30 VITALS — BP 145/91 | HR 92 | Ht 68.0 in | Wt 211.1 lb

## 2015-04-30 DIAGNOSIS — E669 Obesity, unspecified: Secondary | ICD-10-CM | POA: Diagnosis not present

## 2015-04-30 MED ORDER — CYANOCOBALAMIN 1000 MCG/ML IJ SOLN
1000.0000 ug | Freq: Once | INTRAMUSCULAR | Status: AC
  Administered 2015-04-30: 1000 ug via INTRAMUSCULAR

## 2015-04-30 NOTE — Progress Notes (Signed)
Patient ID: Linda Jimenez, female   DOB: 01-May-1976, 39 y.o.   MRN: [id number] Pt presents for weight, B/P, B-12 injection. No side effects of medication-Phentermine, or B-12.  Weight loss of 6 lbs. Encouraged eating healthy and exercise. Pt states she weighed about 217 lbs last visit not 210 lbs.

## 2015-05-11 ENCOUNTER — Encounter: Payer: Self-pay | Admitting: *Deleted

## 2015-05-14 ENCOUNTER — Ambulatory Visit: Payer: Managed Care, Other (non HMO) | Admitting: Anesthesiology

## 2015-05-14 ENCOUNTER — Encounter: Payer: Self-pay | Admitting: Anesthesiology

## 2015-05-14 ENCOUNTER — Encounter
Admission: RE | Disposition: A | Discharge status: Home / Self Care | Payer: Self-pay | Source: Ambulatory Visit | Attending: Gastroenterology

## 2015-05-14 ENCOUNTER — Ambulatory Visit
Admission: RE | Admit: 2015-05-14 | Discharge: 2015-05-14 | Disposition: A | Discharge status: Home / Self Care | Payer: Managed Care, Other (non HMO) | Source: Ambulatory Visit | Attending: Gastroenterology | Admitting: Gastroenterology

## 2015-05-14 DIAGNOSIS — Z90710 Acquired absence of both cervix and uterus: Secondary | ICD-10-CM | POA: Insufficient documentation

## 2015-05-14 DIAGNOSIS — Z6831 Body mass index (BMI) 31.0-31.9, adult: Secondary | ICD-10-CM | POA: Diagnosis not present

## 2015-05-14 DIAGNOSIS — E559 Vitamin D deficiency, unspecified: Secondary | ICD-10-CM | POA: Diagnosis not present

## 2015-05-14 DIAGNOSIS — Z79899 Other long term (current) drug therapy: Secondary | ICD-10-CM | POA: Insufficient documentation

## 2015-05-14 DIAGNOSIS — E669 Obesity, unspecified: Secondary | ICD-10-CM | POA: Insufficient documentation

## 2015-05-14 DIAGNOSIS — R933 Abnormal findings on diagnostic imaging of other parts of digestive tract: Secondary | ICD-10-CM | POA: Insufficient documentation

## 2015-05-14 DIAGNOSIS — K529 Noninfective gastroenteritis and colitis, unspecified: Secondary | ICD-10-CM | POA: Insufficient documentation

## 2015-05-14 DIAGNOSIS — R1031 Right lower quadrant pain: Secondary | ICD-10-CM | POA: Diagnosis present

## 2015-05-14 HISTORY — PX: COLONOSCOPY: SHX5424

## 2015-05-14 SURGERY — COLONOSCOPY
Anesthesia: General

## 2015-05-14 MED ORDER — LIDOCAINE HCL (CARDIAC) 20 MG/ML IV SOLN
INTRAVENOUS | Status: DC | PRN
  Administered 2015-05-14: 30 mg via INTRAVENOUS

## 2015-05-14 MED ORDER — SODIUM CHLORIDE 0.9 % IV SOLN: INTRAVENOUS | Status: DC

## 2015-05-14 MED ORDER — PROPOFOL 500 MG/50ML IV EMUL
INTRAVENOUS | Status: DC | PRN
Start: 2015-05-14 — End: 2015-05-14
  Administered 2015-05-14: 150 ug/kg/min via INTRAVENOUS

## 2015-05-14 MED ORDER — SODIUM CHLORIDE 0.9 % IV SOLN
INTRAVENOUS | Status: DC
  Administered 2015-05-14: 1000 mL via INTRAVENOUS

## 2015-05-14 MED ORDER — PROPOFOL 10 MG/ML IV BOLUS
INTRAVENOUS | Status: DC | PRN
  Administered 2015-05-14: 50 mg via INTRAVENOUS
  Administered 2015-05-14: 100 mg via INTRAVENOUS
  Administered 2015-05-14: 50 mg via INTRAVENOUS

## 2015-05-14 NOTE — Op Note (Signed)
[facility] Patient Name: Linda Jimenez Procedure Date: 05/14/2015 11:23 AM MRN: [id number] Account #: [id number] Date of Birth: 11/24/1975 Admit Type: Outpatient Age: 39 Room: [facility] ENDO ROOM 4 Gender: Female Note Status: Finalized Procedure:         Colonoscopy Indications:       Abdominal pain in the right lower quadrant, Abnormal CT of                     the GI tract Providers:         Lupita Dawn. Candace Cruise, MD Medicines:         Monitored Anesthesia Care Complications:     No immediate complications. Procedure:         Pre-Anesthesia Assessment:                    - Prior to the procedure, a History and Physical was                     performed, and patient medications, allergies and                     sensitivities were reviewed. The patient's tolerance of                     previous anesthesia was reviewed.                    - The risks and benefits of the procedure and the sedation                     options and risks were discussed with the patient. All                     questions were answered and informed consent was obtained.                    - After reviewing the risks and benefits, the patient was                     deemed in satisfactory condition to undergo the procedure.                    After obtaining informed consent, the colonoscope was                     passed under direct vision. Throughout the procedure, the                     patient's blood pressure, pulse, and oxygen saturations                     were monitored continuously. The Olympus CF-Q160AL                     colonoscope (S#. [id number]) was introduced through the anus                     and advanced to the the terminal ileum, with                     identification of the appendiceal orifice and IC valve.                     The colonoscopy was performed without difficulty. The  patient tolerated the procedure well. The quality of the                      bowel preparation was good. Findings:      The terminal ileum appeared normal. Biopsies were taken with a cold       forceps for histology.      Localized mild inflammation characterized by altered vascularity and       erythema was found in the rectum.      Localized mild inflammation characterized by erosions, erythema and loss       of vascularity was found in the sigmoid colon. Biopsies were taken with       a cold forceps for histology.      The exam was otherwise without abnormality.      Biopsies also taken from right side of colon. Impression:        - The examined portion of the ileum was normal. Biopsied.                    - Localized mild inflammation was found in the rectum.                    - Localized mild inflammation was found in the sigmoid                     colon secondary to colitis. Biopsied.                    - The examination was otherwise normal. Recommendation:    - Discharge patient to home.                    - Await pathology results.                    - The findings and recommendations were discussed with the                     patient. Procedure Code(s): --- Professional ---                    [phone], Colonoscopy, flexible; with biopsy, single or                     multiple Diagnosis Code(s): --- Professional ---                    K62.89, Other specified diseases of anus and rectum                    K52.9, Noninfective gastroenteritis and colitis,                     unspecified                    R10.31, Right lower quadrant pain                    R93.3, Abnormal findings on diagnostic imaging of other                     parts of digestive tract CPT copyright 2014 American Medical Association. All rights reserved. The codes documented in this report are preliminary and upon coder review may  be revised to meet current compliance requirements. Hulen Luster, MD 05/14/2015 11:44:09 AM This report  has been signed  electronically. Number of Addenda: 0 Note Initiated On: 05/14/2015 11:23 AM Scope Withdrawal Time: 0 hours 5 minutes 1 second  Total Procedure Duration: 0 hours 15 minutes 7 seconds       [facility]

## 2015-05-14 NOTE — Anesthesia Preprocedure Evaluation (Signed)
Anesthesia Evaluation  Patient identified by MRN, date of birth, ID band Patient awake    Reviewed: Allergy & Precautions, H&P , NPO status , Patient's Chart, lab work & pertinent test results, reviewed documented beta blocker date and time   History of Anesthesia Complications Negative for: history of anesthetic complications  Airway Mallampati: III  TM Distance: >3 FB Neck ROM: full    Dental no notable dental hx. (+) Caps, Teeth Intact   Pulmonary neg pulmonary ROS,    Pulmonary exam normal breath sounds clear to auscultation       Cardiovascular Exercise Tolerance: Good negative cardio ROS Normal cardiovascular exam Rhythm:regular Rate:Normal     Neuro/Psych negative neurological ROS  negative psych ROS   GI/Hepatic negative GI ROS, Neg liver ROS,   Endo/Other  negative endocrine ROS  Renal/GU negative Renal ROS  negative genitourinary   Musculoskeletal   Abdominal   Peds  Hematology negative hematology ROS (+)   Anesthesia Other Findings Past Medical History:   Obesity                                                      Endometriosis                                                Vitamin D deficiency disease                                 Reproductive/Obstetrics negative OB ROS                             Anesthesia Physical Anesthesia Plan  ASA: I  Anesthesia Plan: General   Post-op Pain Management:    Induction:   Airway Management Planned:   Additional Equipment:   Intra-op Plan:   Post-operative Plan:   Informed Consent: I have reviewed the patients History and Physical, chart, labs and discussed the procedure including the risks, benefits and alternatives for the proposed anesthesia with the patient or authorized representative who has indicated his/her understanding and acceptance.   Dental Advisory Given  Plan Discussed with: Anesthesiologist and  Surgeon  Anesthesia Plan Comments:         Anesthesia Quick Evaluation

## 2015-05-14 NOTE — H&P (Signed)
Primary Care Physician:  No PCP Per Patient Primary Gastroenterologist:  Dr. Candace Cruise  Pre-Procedure History & Physical: HPI:  Linda Jimenez is a 39 y.o. female is here for an colonoscopy.  Past Medical History  Diagnosis Date  . Obesity   . Endometriosis   . Vitamin D deficiency disease     Past Surgical History  Procedure Laterality Date  . Abdominal hysterectomy    . Placement of breast implants  2004    Dr. Cathie Hoops  . 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 Authorizing Provider  cefdinir (OMNICEF) 300 MG capsule Take 1 capsule (300 mg total) by mouth 2 (two) times daily. 01/16/15  Yes Melody Valene Bors, CNM  dicyclomine (BENTYL) 20 MG tablet Take 1 tablet (20 mg total) by mouth 2 (two) times daily. As needed for cramping 04/01/15  Yes Jan Fireman, PA-C  metroNIDAZOLE (FLAGYL) 500 MG tablet Take 1 tablet (500 mg total) by mouth 2 (two) times daily. 03/12/15  Yes Marylene Land, NP  Multiple Vitamin (MULTIVITAMINS PO) Take by mouth.   Yes Historical Provider, MD  ondansetron (ZOFRAN ODT) 4 MG disintegrating tablet Take 1 tablet (4 mg total) by mouth every 8 (eight) hours as needed for nausea or vomiting. 03/12/15  Yes Marylene Land, NP  ondansetron (ZOFRAN) 4 MG tablet Take 1 tablet (4 mg total) by mouth every 6 (six) hours. If needed for nausea  ( 1-2 tablets ) 04/01/15  Yes Jan Fireman, PA-C  oxyCODONE-acetaminophen (ROXICET) 5-325 MG per tablet Take 1 tablet by mouth every 8 (eight) hours as needed for moderate pain or severe pain (Do not drive or operate heavy machinery while taking as can cause drowsiness.). 03/12/15  Yes Marylene Land, NP  phentermine 37.5 MG capsule Take 1 capsule (37.5 mg total) by mouth every morning. 02/27/15  Yes Melody Valene Bors, CNM    Allergies as of 04/16/2015  . (No Known Allergies)    History reviewed. No pertinent family  history.  Social History   Social History  . Marital Status: Married    Spouse Name: N/A  . Number of Children: N/A  . Years of Education: N/A   Occupational History  . Not on file.   Social History Main Topics  . Smoking status: Never Smoker   . Smokeless tobacco: Never Used  . Alcohol Use: Yes     Comment: occas  . Drug Use: No  . Sexual Activity: Yes    Birth Control/ Protection: Surgical   Other Topics Concern  . Not on file   Social History Narrative    Review of Systems: See HPI, otherwise negative ROS  Physical Exam: BP 153/92 mmHg  Pulse 83  Temp(Src) 98.7 F (37.1 C) (Tympanic)  Resp 16  Ht 5\' 8"  (1.727 m)  Wt 93.441 kg (206 lb)  BMI 31.33 kg/m2  SpO2 100%  LMP  (LMP Unknown) General:   Alert,  pleasant and cooperative in NAD Head:  Normocephalic and atraumatic. Neck:  Supple; no masses or thyromegaly. Lungs:  Clear throughout to auscultation.    Heart:  Regular rate and rhythm. Abdomen:  Soft, nontender and nondistended. Normal bowel sounds, without guarding, and without rebound.   Neurologic:  Alert and  oriented x4;  grossly normal neurologically.  Impression/Plan: Linda Jimenez is here for an colonoscopy to be performed for abdominal pain  and abnormal CT.  Risks, benefits, limitations, and alternatives regarding  colonoscopy have been reviewed with the patient.  Questions have been answered.  All parties agreeable.   Larenzo Caples, Lupita Dawn, MD  05/14/2015, 10:37 AM

## 2015-05-14 NOTE — Transfer of Care (Signed)
2Immediate Anesthesia Transfer of Care Note  Patient: Linda Jimenez  Procedure(s) Performed: Procedure(s): COLONOSCOPY (N/A)  Patient Location: Endoscopy Unit  Anesthesia Type:General  Level of Consciousness: awake and alert   Airway & Oxygen Therapy: Patient Spontanous Breathing and Patient connected to nasal cannula oxygen  Post-op Assessment: Report given to RN and Post -op Vital signs reviewed and stable  Post vital signs: Reviewed and stable  Last Vitals:  Filed Vitals:   05/14/15 1001  BP: 153/92  Pulse: 83  Temp: 37.1 C  Resp: 16    Complications: No apparent anesthesia complications

## 2015-05-15 NOTE — Anesthesia Postprocedure Evaluation (Signed)
  Anesthesia Post-op Note  Patient: Linda Jimenez  Procedure(s) Performed: Procedure(s): COLONOSCOPY (N/A)  Anesthesia type:General  Patient location: PACU  Post pain: Pain level controlled  Post assessment: Post-op Vital signs reviewed, Patient's Cardiovascular Status Stable, Respiratory Function Stable, Patent Airway and No signs of Nausea or vomiting  Post vital signs: Reviewed and stable  Last Vitals:  Filed Vitals:   05/14/15 1219  BP: 146/86  Pulse: 77  Temp:   Resp: 14    Level of consciousness: awake, alert  and patient cooperative  Complications: No apparent anesthesia complications

## 2015-05-16 ENCOUNTER — Encounter: Payer: Self-pay | Admitting: Gastroenterology

## 2015-05-16 LAB — SURGICAL PATHOLOGY

## 2015-05-22 ENCOUNTER — Encounter: Payer: Self-pay | Admitting: *Deleted

## 2015-05-28 ENCOUNTER — Ambulatory Visit: Payer: Managed Care, Other (non HMO)

## 2015-08-08 ENCOUNTER — Other Ambulatory Visit: Payer: Self-pay | Admitting: Obstetrics and Gynecology

## 2015-11-02 ENCOUNTER — Encounter: Payer: 59 | Admitting: Obstetrics and Gynecology

## 2015-12-28 ENCOUNTER — Other Ambulatory Visit: Payer: Self-pay | Admitting: Obstetrics and Gynecology

## 2016-07-29 ENCOUNTER — Ambulatory Visit
Admission: EM | Admit: 2016-07-29 | Discharge: 2016-07-29 | Disposition: A | Discharge status: Home / Self Care | Payer: Managed Care, Other (non HMO) | Attending: Family Medicine | Admitting: Family Medicine

## 2016-07-29 DIAGNOSIS — J111 Influenza due to unidentified influenza virus with other respiratory manifestations: Secondary | ICD-10-CM

## 2016-07-29 DIAGNOSIS — R69 Illness, unspecified: Secondary | ICD-10-CM | POA: Diagnosis not present

## 2016-07-29 MED ORDER — OSELTAMIVIR PHOSPHATE 75 MG PO CAPS: 75.0000 mg | ORAL_CAPSULE | Freq: Two times a day (BID) | ORAL | 0 refills | Status: DC

## 2016-07-29 MED ORDER — BENZONATATE 100 MG PO CAPS: 100.0000 mg | ORAL_CAPSULE | Freq: Three times a day (TID) | ORAL | 0 refills | Status: DC | PRN

## 2016-07-29 NOTE — ED Triage Notes (Signed)
Pt c/o cough, fever, hot and cold chills that started yesterday.

## 2016-07-29 NOTE — ED Provider Notes (Signed)
[facility]    CSN: [id number] Arrival date & time: 07/29/16  0813     History   Chief Complaint Chief Complaint  Patient presents with  . Cough    HPI Linda Jimenez is a 41 y.o. female.   The history is provided by the patient.  URI  Presenting symptoms: congestion, cough, fatigue, fever and rhinorrhea   Severity:  Moderate Onset quality:  Sudden Duration:  1 day Timing:  Constant Progression:  Worsening Chronicity:  New Relieved by:  Nothing Worsened by:  Nothing Ineffective treatments:  None tried Associated symptoms: myalgias   Associated symptoms: no arthralgias   Risk factors: sick contacts (flu contacts at work)   Risk factors: not elderly, no chronic cardiac disease, no chronic kidney disease, no chronic respiratory disease, no diabetes mellitus, no immunosuppression, no recent illness and no recent travel     Past Medical History:  Diagnosis Date  . Endometriosis   . Obesity   . Vitamin D deficiency disease     Patient Active Problem List   Diagnosis Date Noted  . 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 ABDOMINAL HYSTERECTOMY    . BREAST BIOPSY Left September 2014   Left breast, 11:00 core biopsy Fibroadenoma  . COLONOSCOPY N/A 05/14/2015   Procedure: COLONOSCOPY;  Surgeon: Hulen Luster, MD;  Location: [facility] ENDOSCOPY;  Service: Gastroenterology;  Laterality: N/A;  . PLACEMENT OF BREAST IMPLANTS  2004   Dr. Cathie Hoops    OB History    Gravida Para Term Preterm AB Living   2 2       2    SAB TAB Ectopic Multiple Live Births                  Obstetric Comments   1st Menstrual Cycle:  10 1st Pregnancy:  21       Home Medications    Prior to Admission medications   Medication Sig Start Date End Date Taking? Authorizing Provider  dicyclomine (BENTYL) 20 MG tablet Take 1 tablet (20 mg total) by mouth 2 (two) times daily. As needed for cramping 04/01/15  Yes Jan Fireman, PA-C    Multiple Vitamin (MULTIVITAMINS PO) Take by mouth.   Yes Historical Provider, MD  Vitamin D, Ergocalciferol, (DRISDOL) 50000 units CAPS capsule TAKE ONE CAPSULE BY MOUTH EVERY WEEK 12/28/15  Yes Melody N Shambley, CNM  benzonatate (TESSALON) 100 MG capsule Take 1 capsule (100 mg total) by mouth 3 (three) times daily as needed for cough. 07/29/16   Norval Gable, MD  oseltamivir (TAMIFLU) 75 MG capsule Take 1 capsule (75 mg total) by mouth 2 (two) times daily. 07/29/16   Norval Gable, MD    Family History History reviewed. No pertinent family history.  Social History Social History  Substance Use Topics  . Smoking status: Never Smoker  . Smokeless tobacco: Never Used  . Alcohol use Yes     Comment: occas     Allergies   Patient has no known allergies.   Review of Systems Review of Systems  Constitutional: Positive for fatigue and fever.  HENT: Positive for congestion and rhinorrhea.   Respiratory: Positive for cough.   Musculoskeletal: Positive for myalgias. Negative for arthralgias.     Physical Exam Triage Vital Signs ED Triage Vitals  Enc Vitals Group     BP 07/29/16 0827 (!) 159/78     Pulse Rate 07/29/16 0827 96  Resp 07/29/16 0827 18     Temp 07/29/16 0827 99 F (37.2 C)     Temp Source 07/29/16 0827 Oral     SpO2 07/29/16 0827 99 %     Weight 07/29/16 0828 245 lb (111.1 kg)     Height 07/29/16 0828 5\' 8"  (1.727 m)     Head Circumference --      Peak Flow --      Pain Score 07/29/16 0829 8     Pain Loc --      Pain Edu? --      Excl. in [place]? --    No data found.   Updated Vital Signs BP (!) 159/78 (BP Location: Left Arm)   Pulse 96   Temp 99 F (37.2 C) (Oral)   Resp 18   Ht 5\' 8"  (1.727 m)   Wt 245 lb (111.1 kg)   LMP  (LMP Unknown)   SpO2 99%   BMI 37.25 kg/m   Visual Acuity Right Eye Distance:   Left Eye Distance:   Bilateral Distance:    Right Eye Near:   Left Eye Near:    Bilateral Near:     Physical Exam  Constitutional: She  appears well-developed and well-nourished. No distress.  HENT:  Head: Normocephalic and atraumatic.  Right Ear: Tympanic membrane, external ear and ear canal normal.  Left Ear: Tympanic membrane, external ear and ear canal normal.  Nose: Mucosal edema and rhinorrhea present. No nose lacerations, sinus tenderness, nasal deformity, septal deviation or nasal septal hematoma. No epistaxis.  No foreign bodies. Right sinus exhibits no maxillary sinus tenderness and no frontal sinus tenderness. Left sinus exhibits no maxillary sinus tenderness and no frontal sinus tenderness.  Mouth/Throat: Uvula is midline, oropharynx is clear and moist and mucous membranes are normal. No oropharyngeal exudate.  Eyes: Conjunctivae and EOM are normal. Pupils are equal, round, and reactive to light. Right eye exhibits no discharge. Left eye exhibits no discharge. No scleral icterus.  Neck: Normal range of motion. Neck supple. No thyromegaly present.  Cardiovascular: Normal rate, regular rhythm and normal heart sounds.   Pulmonary/Chest: Effort normal and breath sounds normal. No respiratory distress. She has no wheezes. She has no rales.  Lymphadenopathy:    She has no cervical adenopathy.  Skin: She is not diaphoretic.  Nursing note and vitals reviewed.    UC Treatments / Results  Labs (all labs ordered are listed, but only abnormal results are displayed) Labs Reviewed - No data to display  EKG  EKG Interpretation None       Radiology No results found.  Procedures Procedures (including critical care time)  Medications Ordered in UC Medications - No data to display   Initial Impression / Assessment and Plan / UC Course  I have reviewed the triage vital signs and the nursing notes.  Pertinent labs & imaging results that were available during my care of the patient were reviewed by me and considered in my medical decision making (see chart for details).       Final Clinical Impressions(s) / UC  Diagnoses   Final diagnoses:  Influenza-like illness    New Prescriptions Discharge Medication List as of 07/29/2016  8:53 AM    START taking these medications   Details  benzonatate (TESSALON) 100 MG capsule Take 1 capsule (100 mg total) by mouth 3 (three) times daily as needed for cough., Starting Tue 07/29/2016, Normal    oseltamivir (TAMIFLU) 75 MG capsule Take 1 capsule (75 mg  total) by mouth 2 (two) times daily., Starting Tue 07/29/2016, Normal       1. diagnosis reviewed with patient 2. rx as per orders above; reviewed possible side effects, interactions, risks and benefits  3. Recommend supportive treatment with increased fluids, otc analgesics 4. Follow-up prn if symptoms worsen or don't improve   Norval Gable, MD 07/29/16 [phone]

## 2016-11-28 ENCOUNTER — Encounter: Payer: Self-pay | Admitting: Obstetrics and Gynecology

## 2016-11-28 ENCOUNTER — Ambulatory Visit (INDEPENDENT_AMBULATORY_CARE_PROVIDER_SITE_OTHER): Payer: Managed Care, Other (non HMO) | Admitting: Obstetrics and Gynecology

## 2016-11-28 VITALS — BP 128/78 | Ht 68.0 in | Wt 253.0 lb

## 2016-11-28 DIAGNOSIS — F329 Major depressive disorder, single episode, unspecified: Secondary | ICD-10-CM

## 2016-11-28 DIAGNOSIS — R600 Localized edema: Secondary | ICD-10-CM

## 2016-11-28 DIAGNOSIS — R635 Abnormal weight gain: Secondary | ICD-10-CM

## 2016-11-28 DIAGNOSIS — F32A Depression, unspecified: Secondary | ICD-10-CM

## 2016-11-28 MED ORDER — PHENTERMINE HCL 37.5 MG PO TABS: 37.5000 mg | ORAL_TABLET | Freq: Every day | ORAL | 2 refills | Status: DC

## 2016-11-28 MED ORDER — CYANOCOBALAMIN 1000 MCG/ML IJ SOLN: 1000.0000 ug | INTRAMUSCULAR | 1 refills | Status: DC

## 2016-11-28 MED ORDER — FLUOXETINE HCL 10 MG PO CAPS: 10.0000 mg | ORAL_CAPSULE | Freq: Every day | ORAL | 3 refills | Status: DC

## 2016-11-28 NOTE — Patient Instructions (Signed)
Thank you for enrolling in [facility]. Please follow the instructions below to securely access your online medical record. MyChart allows you to send messages to your doctor, view your test results, renew your prescriptions, schedule appointments, and more.  How Do I Sign Up? 1. In your Internet browser, go to [email or web address]. 2. Click on the New  User? link in the Sign In box.  3. Enter your MyChart Access Code exactly as it appears below. You will not need to use this code after you have completed the sign-up process. If you do not sign up before the expiration date, you must request a new code. MyChart Access Code: [email or web address] Expires: 01/27/2017 11:31 AM  4. Enter the last four digits of your Social Security Number (xxxx) and Date of Birth (mm/dd/yyyy) as indicated and click Next. You will be taken to the next sign-up page. 5. Create a MyChart ID. This will be your MyChart login ID and cannot be changed, so think of one that is secure and easy to remember. 6. Create a MyChart password. You can change your password at any time. 7. Enter your Password Reset Question and Answer and click Next. This can be used at a later time if you forget your password.  8. Select your communication preference, and if applicable enter your e-mail address. You will receive e-mail notification when new information is available in MyChart by choosing to receive e-mail notifications and filling in your e-mail. 9. Click Sign In. You can now view your medical record.   Additional Information If you have questions, you can email [email or web address] or call [phone] to talk to our [place] staff. Remember, MyChart is NOT to be used for urgent needs. For medical emergencies, dial 911.   Major Depressive Disorder, Adult Major depressive disorder (MDD) is a mental health condition. It may also be called clinical depression or unipolar depression. MDD usually causes feelings of  sadness, hopelessness, or helplessness. MDD can also cause physical symptoms. It can interfere with work, school, relationships, and other everyday activities. MDD may be mild, moderate, or severe. It may occur once (single episode major depressive disorder) or it may occur multiple times (recurrent major depressive disorder). What are the causes? The exact cause of this condition is not known. MDD is most likely caused by a combination of things, which may include:  Genetic factors. These are traits that are passed along from parent to child.  Individual factors. Your personality, your behavior, and the way you handle your thoughts and feelings may contribute to MDD. This includes personality traits and behaviors learned from others.  Physical factors, such as: ? Differences in the part of your brain that controls emotion. This part of your brain may be different than it is in people who do not have MDD. ? Long-term (chronic) medical or psychiatric illnesses.  Social factors. Traumatic experiences or major life changes may play a role in the development of MDD.  What increases the risk? This condition is more likely to develop in women. The following factors may also make you more likely to develop MDD:  A family history of depression.  Troubled family relationships.  Abnormally low levels of certain brain chemicals.  Traumatic events in childhood, especially abuse or the loss of a parent.  Being under a lot of stress, or long-term stress, especially from upsetting life experiences or losses.  A history of: ? Chronic physical illness. ? Other mental health disorders. ? Substance abuse.  Poor living  conditions.  Experiencing social exclusion or discrimination on a regular basis.  What are the signs or symptoms? The main symptoms of MDD typically include:  Constant depressed or irritable mood.  Loss of interest in things and activities.  MDD symptoms may also  include:  Sleeping or eating too much or too little.  Unexplained weight change.  Fatigue or low energy.  Feelings of worthlessness or guilt.  Difficulty thinking clearly or making decisions.  Thoughts of suicide or of harming others.  Physical agitation or weakness.  Isolation.  Severe cases of MDD may also occur with other symptoms, such as:  Delusions or hallucinations, in which you imagine things that are not real (psychotic depression).  Low-level depression that lasts at least a year (chronic depression or persistent depressive disorder).  Extreme sadness and hopelessness (melancholic depression).  Trouble speaking and moving (catatonic depression).  How is this diagnosed? This condition may be diagnosed based on:  Your symptoms.  Your medical history, including your mental health history. This may involve tests to evaluate your mental health. You may be asked questions about your lifestyle, including any drug and alcohol use, and how long you have had symptoms of MDD.  A physical exam.  Blood tests to rule out other conditions.  You must have a depressed mood and at least four other MDD symptoms most of the day, nearly every day in the same 2-week timeframe before your health care provider can confirm a diagnosis of MDD. How is this treated? This condition is usually treated by mental health professionals, such as psychologists, psychiatrists, and clinical social workers. You may need more than one type of treatment. Treatment may include:  Psychotherapy. This is also called talk therapy or counseling. Types of psychotherapy include: ? Cognitive behavioral therapy (CBT). This type of therapy teaches you to recognize unhealthy feelings, thoughts, and behaviors, and replace them with positive thoughts and actions. ? Interpersonal therapy (IPT). This helps you to improve the way you relate to and communicate with others. ? Family therapy. This treatment includes  members of your family.  Medicine to treat anxiety and depression, or to help you control certain emotions and behaviors.  Lifestyle changes, such as: ? Limiting alcohol and drug use. ? Exercising regularly. ? Getting plenty of sleep. ? Making healthy eating choices. ? Spending more time outdoors.  Treatments involving stimulation of the brain can be used in situations with extremely severe symptoms, or when medicine or other therapies do not work over time. These treatments include electroconvulsive therapy, transcranial magnetic stimulation, and vagal nerve stimulation. Follow these instructions at home: Activity  Return to your normal activities as told by your health care provider.  Exercise regularly and spend time outdoors as told by your health care provider. General instructions  Take over-the-counter and prescription medicines only as told by your health care provider.  Do not drink alcohol. If you drink alcohol, limit your alcohol intake to no more than 1 drink a day for nonpregnant women and 2 drinks a day for men. One drink equals 12 oz of beer, 5 oz of wine, or 1 oz of hard liquor. Alcohol can affect any antidepressant medicines you are taking. Talk to your health care provider about your alcohol use.  Eat a healthy diet and get plenty of sleep.  Find activities that you enjoy doing, and make time to do them.  Consider joining a support group. Your health care provider may be able to recommend a support group.  Keep all  follow-up visits as told by your health care provider. This is important. Where to find more information: [employer] on Mental Illness  www.nami.org  U.S. National Institute of Mental Health  https://carter.com/  National Suicide Prevention Lifeline  1-800-273-TALK 949 369 9567). This is free, 24-hour help.  Contact a health care provider if:  Your symptoms get worse.  You develop new symptoms. Get help right away if:  You  self-harm.  You have serious thoughts about hurting yourself or others.  You see, hear, taste, smell, or feel things that are not present (hallucinate). This information is not intended to replace advice given to you by your health care provider. Make sure you discuss any questions you have with your health care provider. Document Released: 10/11/2012 Document Revised: 02/21/2016 Document Reviewed: 12/26/2015 Elsevier Interactive Patient Education  2017 [employer].

## 2016-11-28 NOTE — Progress Notes (Signed)
Subjective:     Patient ID: Linda Jimenez, female   DOB: 22-May-1976, 41 y.o.   MRN: [id number]  HPI Concerned over fluid retention in feet, fatigue, weight gain, since 6-8 months. Started when son left for [employer]. Under a lot of stress. Working long hours so not exercising, and too tired by them time she gets home.  Both feet swell but worse on left, and has a red patch on inner right lower leg, no heat or drainage or pain. Has not tried compression socks.  Diagnosed with ulcerative colitis and goes to bathroom multiple times . Has been difficult to adjust to the diagnosis and bowel pains/frequent bathroom visits.  Feels very depressed, husband is on her about her weight, they are not having sex. Tearful all the time. And just wants to sleep. Doesn't feel like doing the things with friends and family that she used to look forward to.   Review of Systems Depression screen [facility] 2/9 11/28/2016  Decreased Interest 3  Down, Depressed, Hopeless 3  PHQ - 2 Score 6  Altered sleeping 3  Tired, decreased energy 3  Change in appetite 3  Feeling bad or failure about yourself  3  Trouble concentrating 3  Moving slowly or fidgety/restless 3  Suicidal thoughts 3  PHQ-9 Score 27      Objective:   Physical Exam A&OI x4 Well groomed female tearful upon entering room Blood pressure 128/78, height 5\' 8"  (1.727 m), weight 253 lb (114.8 kg). Body mass index is 38.47 kg/m.  Thyroid not enlarged Mild non-pitting edema in both ankles.  + pedal pulses Thought processes intact and appropriate.    Assessment:     Depression Weight gain Pedal edema     Plan:     Labs obtained and will follow up accordingly Counseled on common causes of weight gain and fluid retention, including thyroid dysfunction, circulation issues, stress, along with  Lifestyle factors. Counseled on treatment options for depression and strongly recommend medication. Started on prozac 10mg  daily and will adjust accordingly. To  message me on MyChart in 2-3 weeks to let me know how she is feeling. Restarted on weight loss medications and B12 injection given today.  Will return in 5-6 weeks for annual exam and medication check. Sooner if needed.

## 2016-11-29 LAB — CBC
HEMATOCRIT: 38.7 % (ref 34.0–46.6)
Hemoglobin: 12.6 g/dL (ref 11.1–15.9)
MCH: 29.1 pg (ref 26.6–33.0)
MCHC: 32.6 g/dL (ref 31.5–35.7)
MCV: 89 fL (ref 79–97)
Platelets: 342 10*3/uL (ref 150–379)
RBC: 4.33 x10E6/uL (ref 3.77–5.28)
RDW: 13.9 % (ref 12.3–15.4)
WBC: 7.5 10*3/uL (ref 3.4–10.8)

## 2016-11-29 LAB — COMPREHENSIVE METABOLIC PANEL
ALBUMIN: 4.2 g/dL (ref 3.5–5.5)
ALK PHOS: 130 IU/L — AB (ref 39–117)
ALT: 12 IU/L (ref 0–32)
AST: 10 IU/L (ref 0–40)
Albumin/Globulin Ratio: 1.4 (ref 1.2–2.2)
BUN/Creatinine Ratio: 21 (ref 9–23)
BUN: 12 mg/dL (ref 6–24)
CHLORIDE: 101 mmol/L (ref 96–106)
CO2: 25 mmol/L (ref 18–29)
CREATININE: 0.58 mg/dL (ref 0.57–1.00)
Calcium: 9.3 mg/dL (ref 8.7–10.2)
GFR calc non Af Amer: 113 mL/min/{1.73_m2} (ref >59)
GFR, EST AFRICAN AMERICAN: 131 mL/min/{1.73_m2} (ref >59)
GLOBULIN, TOTAL: 2.9 g/dL (ref 1.5–4.5)
Glucose: 122 mg/dL — ABNORMAL HIGH (ref 65–99)
Potassium: 4.3 mmol/L (ref 3.5–5.2)
SODIUM: 144 mmol/L (ref 134–144)
TOTAL PROTEIN: 7.1 g/dL (ref 6.0–8.5)

## 2016-11-29 LAB — B12 AND FOLATE PANEL
Folate: 5.4 ng/mL (ref >3.0)
Vitamin B-12: 481 pg/mL (ref 232–1245)

## 2016-11-29 LAB — THYROID PANEL WITH TSH
FREE THYROXINE INDEX: 1.6 (ref 1.2–4.9)
T3 UPTAKE RATIO: 22 % — AB (ref 24–39)
T4, Total: 7.3 ug/dL (ref 4.5–12.0)
TSH: 2.49 u[IU]/mL (ref 0.450–4.500)

## 2016-11-29 LAB — HEMOGLOBIN A1C
Est. average glucose Bld gHb Est-mCnc: 114 mg/dL
HEMOGLOBIN A1C: 5.6 % (ref 4.8–5.6)

## 2016-11-29 LAB — FERRITIN: Ferritin: 65 ng/mL (ref 15–150)

## 2016-11-29 LAB — VITAMIN D 25 HYDROXY (VIT D DEFICIENCY, FRACTURES): VIT D 25 HYDROXY: 19 ng/mL — AB (ref 30.0–100.0)

## 2016-12-04 ENCOUNTER — Encounter: Payer: Self-pay | Admitting: Obstetrics and Gynecology

## 2016-12-09 ENCOUNTER — Other Ambulatory Visit: Payer: Self-pay | Admitting: Obstetrics and Gynecology

## 2016-12-09 DIAGNOSIS — E559 Vitamin D deficiency, unspecified: Secondary | ICD-10-CM

## 2016-12-09 MED ORDER — VITAMIN D (ERGOCALCIFEROL) 1.25 MG (50000 UNIT) PO CAPS: 50000.0000 [IU] | ORAL_CAPSULE | ORAL | 1 refills | Status: DC

## 2016-12-11 ENCOUNTER — Encounter: Payer: Self-pay | Admitting: Obstetrics and Gynecology

## 2017-01-07 ENCOUNTER — Other Ambulatory Visit: Payer: Self-pay | Admitting: Obstetrics and Gynecology

## 2017-01-20 ENCOUNTER — Encounter: Payer: Self-pay | Admitting: Obstetrics and Gynecology

## 2017-01-20 ENCOUNTER — Ambulatory Visit (INDEPENDENT_AMBULATORY_CARE_PROVIDER_SITE_OTHER): Payer: Managed Care, Other (non HMO) | Admitting: Obstetrics and Gynecology

## 2017-01-20 ENCOUNTER — Other Ambulatory Visit: Payer: Self-pay | Admitting: Obstetrics and Gynecology

## 2017-01-20 VITALS — BP 151/97 | HR 89 | Ht 68.0 in | Wt 231.5 lb

## 2017-01-20 DIAGNOSIS — Z01419 Encounter for gynecological examination (general) (routine) without abnormal findings: Secondary | ICD-10-CM | POA: Diagnosis not present

## 2017-01-20 NOTE — Patient Instructions (Signed)
Preventive Care 18-39 Years, Female Preventive care refers to lifestyle choices and visits with your health care provider that can promote health and wellness. What does preventive care include?  A yearly physical exam. This is also called an annual well check.  Dental exams once or twice a year.  Routine eye exams. Ask your health care provider how often you should have your eyes checked.  Personal lifestyle choices, including: ? Daily care of your teeth and gums. ? Regular physical activity. ? Eating a healthy diet. ? Avoiding tobacco and drug use. ? Limiting alcohol use. ? Practicing safe sex. ? Taking vitamin and mineral supplements as recommended by your health care provider. What happens during an annual well check? The services and screenings done by your health care provider during your annual well check will depend on your age, overall health, lifestyle risk factors, and family history of disease. Counseling Your health care provider may ask you questions about your:  Alcohol use.  Tobacco use.  Drug use.  Emotional well-being.  Home and relationship well-being.  Sexual activity.  Eating habits.  Work and work Statistician.  Method of birth control.  Menstrual cycle.  Pregnancy history.  Screening You may have the following tests or measurements:  Height, weight, and BMI.  Diabetes screening. This is done by checking your blood sugar (glucose) after you have not eaten for a while (fasting).  Blood pressure.  Lipid and cholesterol levels. These may be checked every 5 years starting at age 38.  Skin check.  Hepatitis C blood test.  Hepatitis B blood test.  Sexually transmitted disease (STD) testing.  BRCA-related cancer screening. This may be done if you have a family history of breast, ovarian, tubal, or peritoneal cancers.  Pelvic exam and Pap test. This may be done every 3 years starting at age 38. Starting at age 30, this may be done  every 5 years if you have a Pap test in combination with an HPV test.  Discuss your test results, treatment options, and if necessary, the need for more tests with your health care provider. Vaccines Your health care provider may recommend certain vaccines, such as:  Influenza vaccine. This is recommended every year.  Tetanus, diphtheria, and acellular pertussis (Tdap, Td) vaccine. You may need a Td booster every 10 years.  Varicella vaccine. You may need this if you have not been vaccinated.  HPV vaccine. If you are 39 or younger, you may need three doses over 6 months.  Measles, mumps, and rubella (MMR) vaccine. You may need at least one dose of MMR. You may also need a second dose.  Pneumococcal 13-valent conjugate (PCV13) vaccine. You may need this if you have certain conditions and were not previously vaccinated.  Pneumococcal polysaccharide (PPSV23) vaccine. You may need one or two doses if you smoke cigarettes or if you have certain conditions.  Meningococcal vaccine. One dose is recommended if you are age 68-21 years and a first-year college student living in a residence hall, or if you have one of several medical conditions. You may also need additional booster doses.  Hepatitis A vaccine. You may need this if you have certain conditions or if you travel or work in places where you may be exposed to hepatitis A.  Hepatitis B vaccine. You may need this if you have certain conditions or if you travel or work in places where you may be exposed to hepatitis B.  Haemophilus influenzae type b (Hib) vaccine. You may need this  if you have certain risk factors.  Talk to your health care provider about which screenings and vaccines you need and how often you need them. This information is not intended to replace advice given to you by your health care provider. Make sure you discuss any questions you have with your health care provider. Document Released: 08/12/2001 Document Revised:  03/05/2016 Document Reviewed: 04/17/2015 Elsevier Interactive Patient Education  2017 [employer] Inc[employer]  

## 2017-01-20 NOTE — Progress Notes (Signed)
Subjective:   Linda Jimenez is a 41 y.o. G38P2 Caucasian female here for a routine well-woman exam.  No LMP recorded (lmp unknown). Patient has had a hysterectomy.    Current complaints: none PCP: me       doesn't desire labs  Social History: Sexual: heterosexual Marital Status: married Living situation: with family Occupation: unknown occupation Tobacco/alcohol: no tobacco use Illicit drugs: no history of illicit drug use  The following portions of the patient's history were reviewed and updated as appropriate: allergies, current medications, past family history, past medical history, past social history, past surgical history and problem list.  Past Medical History Past Medical History:  Diagnosis Date  . Endometriosis   . Obesity   . Vitamin D deficiency disease     Past Surgical History Past Surgical History:  Procedure Laterality Date  . ABDOMINAL HYSTERECTOMY    . BREAST BIOPSY Left September 2014   Left breast, 11:00 core biopsy Fibroadenoma  . COLONOSCOPY N/A 05/14/2015   Procedure: COLONOSCOPY;  Surgeon: Hulen Luster, MD;  Location: [facility] ENDOSCOPY;  Service: Gastroenterology;  Laterality: N/A;  . PLACEMENT OF BREAST IMPLANTS  2004   Dr. Cathie Hoops    Gynecologic History G2P2  No LMP recorded (lmp unknown). Patient has had a hysterectomy. Contraception: vasectomy Last Pap: ?Marland Kitchen Results were: normal Last mammogram: 2016. Results were: abnormal with normal f/u  Obstetric History OB History  Gravida Para Term Preterm AB Living  2 2       2   SAB TAB Ectopic Multiple Live Births               # Outcome Date GA Lbr Len/2nd Weight Sex Delivery Anes PTL Lv  2 Para           1 Para             Obstetric Comments  1st Menstrual Cycle:  10  1st Pregnancy:  21    Current Medications Current Outpatient Prescriptions on File Prior to Visit  Medication Sig Dispense Refill  . cyanocobalamin (,VITAMIN B-12,) 1000 MCG/ML injection Inject 1 mL (1,000 mcg total) into the muscle  every 30 (thirty) days. 10 mL 1  . FLUoxetine (PROZAC) 10 MG capsule Take 1 capsule (10 mg total) by mouth daily. 30 capsule 3  . phentermine (ADIPEX-P) 37.5 MG tablet Take 1 tablet (37.5 mg total) by mouth daily before breakfast. 30 tablet 2  . Vitamin D, Ergocalciferol, (DRISDOL) 50000 units CAPS capsule Take 1 capsule (50,000 Units total) by mouth 2 (two) times a week. 30 capsule 1   No current facility-administered medications on file prior to visit.     Review of Systems Patient denies any headaches, blurred vision, shortness of breath, chest pain, abdominal pain, problems with bowel movements, urination, or intercourse.  Objective:  BP (!) 151/97   Pulse 89   Ht 5\' 8"  (1.727 m)   Wt 231 lb 8 oz (105 kg)   LMP  (LMP Unknown)   BMI 35.20 kg/m  Physical Exam  General:  Well developed, well nourished, no acute distress. She is alert and oriented x3. Skin:  Warm and dry Neck:  Midline trachea, no thyromegaly or nodules Cardiovascular: Regular rate and rhythm, no murmur heard Lungs:  Effort normal, all lung fields clear to auscultation bilaterally Breasts:  No dominant palpable mass, retraction, or nipple discharge Abdomen:  Soft, non tender, no hepatosplenomegaly or masses Pelvic:  External genitalia is normal in appearance.  The vagina is normal in appearance.  The cervix is bulbous, no CMT.  Thin prep pap is done with HR HPV cotesting. Uterus is felt to be normal size, shape, and contour.  No adnexal masses or tenderness noted.  Extremities:  No swelling or varicosities noted Psych:  She has a normal mood and affect  Assessment:   Healthy well-woman exam Obesity Vit D deficiency  Plan:   F/U 1 year for AE, or sooner if needed Mammogram ordered  Melody Rockney Ghee, CNM

## 2017-01-21 LAB — CYTOLOGY - PAP

## 2017-02-20 ENCOUNTER — Ambulatory Visit (INDEPENDENT_AMBULATORY_CARE_PROVIDER_SITE_OTHER): Payer: Managed Care, Other (non HMO) | Admitting: Obstetrics and Gynecology

## 2017-02-20 VITALS — BP 151/87 | HR 100 | Wt 229.2 lb

## 2017-02-20 DIAGNOSIS — E669 Obesity, unspecified: Secondary | ICD-10-CM

## 2017-02-20 NOTE — Progress Notes (Signed)
Pt presents for  Weight,B/P, B-12 injection. No side effects of medications-Phentermine or B-12. Weight loss _2___ lbs. Encourage eating healthy and exercise. Cyanocobalamin 1cc lot# [id number] exp 06/2018

## 2017-02-22 ENCOUNTER — Encounter: Payer: Self-pay | Admitting: Obstetrics and Gynecology

## 2017-03-03 ENCOUNTER — Other Ambulatory Visit: Payer: Self-pay | Admitting: Obstetrics and Gynecology

## 2017-03-03 MED ORDER — PHENTERMINE HCL 37.5 MG PO TABS: 37.5000 mg | ORAL_TABLET | Freq: Every day | ORAL | 2 refills | Status: DC

## 2017-03-03 MED ORDER — FLUOXETINE HCL 20 MG PO CAPS: 20.0000 mg | ORAL_CAPSULE | Freq: Every day | ORAL | 4 refills | Status: DC

## 2017-03-19 ENCOUNTER — Encounter: Payer: Self-pay | Admitting: Obstetrics and Gynecology

## 2017-03-19 ENCOUNTER — Ambulatory Visit (INDEPENDENT_AMBULATORY_CARE_PROVIDER_SITE_OTHER): Payer: Managed Care, Other (non HMO) | Admitting: Obstetrics and Gynecology

## 2017-03-19 VITALS — BP 148/89 | HR 92 | Wt 229.5 lb

## 2017-03-19 DIAGNOSIS — E669 Obesity, unspecified: Secondary | ICD-10-CM

## 2017-03-19 DIAGNOSIS — E559 Vitamin D deficiency, unspecified: Secondary | ICD-10-CM

## 2017-03-19 MED ORDER — FLUOXETINE HCL 40 MG PO CAPS: 40.0000 mg | ORAL_CAPSULE | Freq: Every day | ORAL | 6 refills | Status: DC

## 2017-03-19 MED ORDER — PHENTERMINE HCL 37.5 MG PO TABS: 37.5000 mg | ORAL_TABLET | Freq: Every day | ORAL | 2 refills | Status: DC

## 2017-03-19 NOTE — Progress Notes (Signed)
SUBJECTIVE:  41 y.o. here for follow-up weight loss visit, previously seen 4 weeks ago. Denies any concerns and feels like medication is not working well this last month. Has lost 28 #s since starting 3 months ago. Also reports still feeling depressed about 50% of each month and wants to go up on dose.  OBJECTIVE:  BP (!) 148/89   Pulse 92   Wt 229 lb 8 oz (104.1 kg)   LMP  (LMP Unknown)   BMI 34.90 kg/m   Body mass index is 34.9 kg/m. Patient appears well. ASSESSMENT:  Obesity Depression  PLAN:  To continue with current medications. B12 1078mcg/ml injection given Will increase prozac to 40mg  daily/ RTC in 6 weeks as planned  Melody [place], CNM

## 2017-03-20 LAB — COMPREHENSIVE METABOLIC PANEL
ALBUMIN: 4.2 g/dL (ref 3.5–5.5)
ALT: 13 IU/L (ref 0–32)
AST: 16 IU/L (ref 0–40)
Albumin/Globulin Ratio: 1.7 (ref 1.2–2.2)
Alkaline Phosphatase: 116 IU/L (ref 39–117)
BUN / CREAT RATIO: 18 (ref 9–23)
BUN: 12 mg/dL (ref 6–24)
Bilirubin Total: <0.2 mg/dL (ref 0.0–1.2)
CALCIUM: 8.9 mg/dL (ref 8.7–10.2)
CO2: 26 mmol/L (ref 20–29)
Chloride: 99 mmol/L (ref 96–106)
Creatinine, Ser: 0.66 mg/dL (ref 0.57–1.00)
GFR calc Af Amer: 125 mL/min/{1.73_m2} (ref >59)
GFR calc non Af Amer: 109 mL/min/{1.73_m2} (ref >59)
GLOBULIN, TOTAL: 2.5 g/dL (ref 1.5–4.5)
Glucose: 94 mg/dL (ref 65–99)
Potassium: 4.1 mmol/L (ref 3.5–5.2)
Sodium: 138 mmol/L (ref 134–144)
TOTAL PROTEIN: 6.7 g/dL (ref 6.0–8.5)

## 2017-03-20 LAB — VITAMIN D 25 HYDROXY (VIT D DEFICIENCY, FRACTURES): Vit D, 25-Hydroxy: 39.4 ng/mL (ref 30.0–100.0)

## 2017-04-17 ENCOUNTER — Ambulatory Visit (INDEPENDENT_AMBULATORY_CARE_PROVIDER_SITE_OTHER): Payer: Managed Care, Other (non HMO) | Admitting: Obstetrics and Gynecology

## 2017-04-17 VITALS — BP 150/94 | HR 92 | Wt 225.0 lb

## 2017-04-17 DIAGNOSIS — E669 Obesity, unspecified: Secondary | ICD-10-CM

## 2017-04-17 MED ORDER — CYANOCOBALAMIN 1000 MCG/ML IJ SOLN
1000.0000 ug | Freq: Once | INTRAMUSCULAR | Status: AC
  Administered 2017-04-17: 1000 ug via INTRAMUSCULAR

## 2017-04-17 NOTE — Progress Notes (Signed)
Patient ID: Linda Jimenez, female   DOB: Oct 29, 1975, 41 y.o.   MRN: [id number] Pt presents for weight, B/P, B-12 injection. No side effects of medication-Phentermine, or B-12.  Weight loss of 4 lbs. Encouraged eating healthy and exercise.

## 2017-04-29 ENCOUNTER — Encounter: Payer: Self-pay | Admitting: Obstetrics and Gynecology

## 2017-05-18 ENCOUNTER — Ambulatory Visit (INDEPENDENT_AMBULATORY_CARE_PROVIDER_SITE_OTHER): Payer: Managed Care, Other (non HMO) | Admitting: Obstetrics and Gynecology

## 2017-05-18 ENCOUNTER — Encounter: Payer: Self-pay | Admitting: Obstetrics and Gynecology

## 2017-05-18 VITALS — BP 148/84 | HR 88 | Wt 224.3 lb

## 2017-05-18 DIAGNOSIS — E669 Obesity, unspecified: Secondary | ICD-10-CM

## 2017-05-18 MED ORDER — CYANOCOBALAMIN 1000 MCG/ML IJ SOLN
1000.0000 ug | Freq: Once | INTRAMUSCULAR | Status: AC
  Administered 2017-05-18: 1000 ug via INTRAMUSCULAR

## 2017-05-18 NOTE — Progress Notes (Signed)
Pt is here for wt, bp check,b-12 inj She is doing well  05/18/17 wt- 224lb 04/17/17 wt- 225lb

## 2017-05-22 ENCOUNTER — Encounter: Payer: Self-pay | Admitting: Obstetrics and Gynecology

## 2017-05-27 ENCOUNTER — Other Ambulatory Visit: Payer: Self-pay | Admitting: Obstetrics and Gynecology

## 2017-05-27 MED ORDER — MAGIC MOUTHWASH W/LIDOCAINE: 5.0000 mL | Freq: Three times a day (TID) | ORAL | 0 refills | Status: DC | PRN

## 2017-06-15 ENCOUNTER — Ambulatory Visit (INDEPENDENT_AMBULATORY_CARE_PROVIDER_SITE_OTHER): Payer: Managed Care, Other (non HMO) | Admitting: Obstetrics and Gynecology

## 2017-06-15 ENCOUNTER — Encounter: Payer: Self-pay | Admitting: Obstetrics and Gynecology

## 2017-06-15 VITALS — BP 130/84 | HR 88 | Wt 222.5 lb

## 2017-06-15 DIAGNOSIS — E669 Obesity, unspecified: Secondary | ICD-10-CM

## 2017-06-15 NOTE — Progress Notes (Signed)
Pt is here for wt, bp check, b-12inj She is doing well  06/15/17 wt- 222.5lb 05/18/17 wt- 224lb

## 2017-06-18 ENCOUNTER — Encounter: Payer: Self-pay | Admitting: Obstetrics and Gynecology

## 2017-06-18 ENCOUNTER — Emergency Department
Admission: EM | Admit: 2017-06-18 | Discharge: 2017-06-18 | Disposition: A | Discharge status: Home / Self Care | Payer: Managed Care, Other (non HMO) | Attending: Emergency Medicine | Admitting: Emergency Medicine

## 2017-06-18 ENCOUNTER — Emergency Department: Payer: Managed Care, Other (non HMO)

## 2017-06-18 ENCOUNTER — Other Ambulatory Visit: Payer: Self-pay

## 2017-06-18 ENCOUNTER — Encounter: Payer: Self-pay | Admitting: Emergency Medicine

## 2017-06-18 DIAGNOSIS — Z79899 Other long term (current) drug therapy: Secondary | ICD-10-CM | POA: Diagnosis not present

## 2017-06-18 DIAGNOSIS — K2900 Acute gastritis without bleeding: Secondary | ICD-10-CM

## 2017-06-18 DIAGNOSIS — R1013 Epigastric pain: Secondary | ICD-10-CM | POA: Insufficient documentation

## 2017-06-18 DIAGNOSIS — D1803 Hemangioma of intra-abdominal structures: Secondary | ICD-10-CM | POA: Diagnosis not present

## 2017-06-18 LAB — URINALYSIS, COMPLETE (UACMP) WITH MICROSCOPIC
BACTERIA UA: NONE SEEN
BILIRUBIN URINE: NEGATIVE
Glucose, UA: NEGATIVE mg/dL
KETONES UR: NEGATIVE mg/dL
LEUKOCYTES UA: NEGATIVE
NITRITE: NEGATIVE
PROTEIN: NEGATIVE mg/dL
Specific Gravity, Urine: 1.015 (ref 1.005–1.030)
pH: 6 (ref 5.0–8.0)

## 2017-06-18 LAB — CBC
HEMATOCRIT: 38 % (ref 35.0–47.0)
HEMOGLOBIN: 12.8 g/dL (ref 12.0–16.0)
MCH: 29.8 pg (ref 26.0–34.0)
MCHC: 33.8 g/dL (ref 32.0–36.0)
MCV: 88.1 fL (ref 80.0–100.0)
Platelets: 357 10*3/uL (ref 150–440)
RBC: 4.32 MIL/uL (ref 3.80–5.20)
RDW: 13.7 % (ref 11.5–14.5)
WBC: 7.1 10*3/uL (ref 3.6–11.0)

## 2017-06-18 LAB — TROPONIN I

## 2017-06-18 LAB — COMPREHENSIVE METABOLIC PANEL
ALBUMIN: 4.1 g/dL (ref 3.5–5.0)
ALT: 34 U/L (ref 14–54)
ANION GAP: 9 (ref 5–15)
AST: 42 U/L — AB (ref 15–41)
Alkaline Phosphatase: 131 U/L — ABNORMAL HIGH (ref 38–126)
BILIRUBIN TOTAL: 0.6 mg/dL (ref 0.3–1.2)
BUN: 12 mg/dL (ref 6–20)
CHLORIDE: 100 mmol/L — AB (ref 101–111)
CO2: 26 mmol/L (ref 22–32)
Calcium: 9.1 mg/dL (ref 8.9–10.3)
Creatinine, Ser: 0.61 mg/dL (ref 0.44–1.00)
GFR calc Af Amer: >60 mL/min (ref >60)
GFR calc non Af Amer: >60 mL/min (ref >60)
GLUCOSE: 99 mg/dL (ref 65–99)
POTASSIUM: 3.7 mmol/L (ref 3.5–5.1)
Sodium: 135 mmol/L (ref 135–145)
TOTAL PROTEIN: 7.5 g/dL (ref 6.5–8.1)

## 2017-06-18 LAB — LIPASE, BLOOD: LIPASE: 21 U/L (ref 11–51)

## 2017-06-18 MED ORDER — GI COCKTAIL ~~LOC~~
30.0000 mL | Freq: Once | ORAL | Status: AC
  Administered 2017-06-18: 30 mL via ORAL
  Filled 2017-06-18: qty 30

## 2017-06-18 MED ORDER — ONDANSETRON 4 MG PO TBDP
4.0000 mg | ORAL_TABLET | Freq: Once | ORAL | Status: AC | PRN
Start: 2017-06-18 — End: 2017-06-18
  Administered 2017-06-18: 4 mg via ORAL
  Filled 2017-06-18: qty 1

## 2017-06-18 MED ORDER — OXYCODONE-ACETAMINOPHEN 5-325 MG PO TABS
1.0000 | ORAL_TABLET | ORAL | Status: DC | PRN
  Administered 2017-06-18: 1 via ORAL
  Filled 2017-06-18: qty 1

## 2017-06-18 NOTE — Discharge Instructions (Signed)
You are evaluated for upper abdominal pain, and although no certain cause was found, your exam and evaluation are overall reassuring in the emergency department today.  As we discussed, I am most suspicious of acid reflux/GERD/gastritis.  Avoid fatty, spicy, acidic or citrus, caffeine, or soda for the next 1-2 weeks.  Take over-the-counter Pepcid 20 mg daily for about 2 weeks.  You may try over-the-counter Maalox, use as directed on labeling for immediate symptoms.  Return to the emergency department immediately for any worsening condition including uncontrolled pain, black or bloody stool, vomiting blood, fever, chest pain or trouble breathing, or any other symptoms concerning to you.

## 2017-06-18 NOTE — ED Notes (Signed)
Pt transported to US

## 2017-06-18 NOTE — ED Provider Notes (Signed)
[facility] Emergency Department Provider Note ____________________________________________   I have reviewed the triage vital signs and the triage nursing note.  HISTORY  Chief Complaint Abdominal Pain   Historian Patient  HPI Linda Jimenez is a 41 y.o. female presents for evaluation of epigastric pain that was located right in the center of epigastrium and radiated through to the back at times goes up into the chest and feels like burning.  No typical history with GERD.  She does have a history of ulcerative colitis.  Symptoms started overnight and last about a hour hour and a half and then she went to sleep and then they were worse this morning again.  It eased off somewhat now and is relatively mild.  No vomiting blood.  Positive nausea without vomiting.  No fevers.  No lower abdominal pain or pelvic pain.  No black or bloody stools.   Past Medical History:  Diagnosis Date  . Endometriosis   . Obesity   . Vitamin D deficiency disease     Patient Active Problem List   Diagnosis Date Noted  . Vitamin D deficiency 12/09/2016  . 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 ABDOMINAL HYSTERECTOMY    . BREAST BIOPSY Left September 2014   Left breast, 11:00 core biopsy Fibroadenoma  . COLONOSCOPY N/A 05/14/2015   Procedure: COLONOSCOPY;  Surgeon: Hulen Luster, MD;  Location[facility] ENDOSCOPY;  Service: Gastroenterology;  Laterality: N/A;  . PLACEMENT OF BREAST IMPLANTS  2004   Dr. Cathie Hoops    Prior to Admission medications   Medication Sig Start Date End Date Taking? Authorizing Provider  cyanocobalamin (,VITAMIN B-12,) 1000 MCG/ML injection Inject 1 mL (1,000 mcg total) into the muscle every 30 (thirty) days. 11/28/16   Shambley, Melody N, CNM  FLUoxetine (PROZAC) 40 MG capsule Take 1 capsule (40 mg total) by mouth daily. 03/19/17   Shambley, Melody N, CNM  magic mouthwash w/lidocaine SOLN Take 5 mLs by  mouth 3 (three) times daily as needed for mouth pain. Patient not taking: Reported on 06/15/2017 05/27/17   Joylene Igo, CNM  phentermine (ADIPEX-P) 37.5 MG tablet Take 1 tablet (37.5 mg total) by mouth daily before breakfast. 03/19/17   Shambley, Melody N, CNM  Vitamin D, Ergocalciferol, (DRISDOL) 50000 units CAPS capsule Take 1 capsule (50,000 Units total) by mouth 2 (two) times a week. 12/11/16   Shambley, Melody N, CNM    No Known Allergies  History reviewed. No pertinent family history.  Social History Social History   Tobacco Use  . Smoking status: Never Smoker  . Smokeless tobacco: Never Used  Substance Use Topics  . Alcohol use: Yes    Comment: occas  . Drug use: No    Review of Systems  Constitutional: Negative for fever. Eyes: Negative for visual changes. ENT: Negative for sore throat. Cardiovascular: Negative for chest pain. Respiratory: Negative for shortness of breath. Gastrointestinal: Negative for vomiting and diarrhea. Genitourinary: Negative for dysuria or flank pain. Musculoskeletal: Back pain that went through to the back earlier, no pain in the back now. Skin: Negative for rash. Neurological: Negative for headache.  ____________________________________________   PHYSICAL EXAM:  VITAL SIGNS: ED Triage Vitals  Enc Vitals Group     BP 06/18/17 1045 139/83     Pulse Rate 06/18/17 1045 (!) 106     Resp 06/18/17 1045 20     Temp 06/18/17 1045 98.1 F (36.7 C)  Temp Source 06/18/17 1045 Oral     SpO2 06/18/17 1045 100 %     Weight 06/18/17 1045 220 lb (99.8 kg)     Height 06/18/17 1045 5\' 8"  (1.727 m)     Head Circumference --      Peak Flow --      Pain Score 06/18/17 1044 9     Pain Loc --      Pain Edu? --      Excl. in [place]? --      Constitutional: Alert and oriented. Well appearing and in no distress. HEENT   Head: Normocephalic and atraumatic.      Eyes: Conjunctivae are normal. Pupils equal and round.       Ears:          Nose: No congestion/rhinnorhea.   Mouth/Throat: Mucous membranes are moist.   Neck: No stridor. Cardiovascular/Chest: Normal rate, regular rhythm.  No murmurs, rubs, or gallops. Respiratory: Normal respiratory effort without tachypnea nor retractions. Breath sounds are clear and equal bilaterally. No wheezes/rales/rhonchi. Gastrointestinal: Soft. No distention, no guarding, no rebound.  Mild tenderness in epigastrium. Genitourinary/rectal:Deferred Musculoskeletal: Nontender with normal range of motion in all extremities. No joint effusions.  No lower extremity tenderness.  No edema. Neurologic:  Normal speech and language. No gross or focal neurologic deficits are appreciated. Skin:  Skin is warm, dry and intact. No rash noted. Psychiatric: Mood and affect are normal. Speech and behavior are normal. Patient exhibits appropriate insight and judgment.   ____________________________________________  LABS (pertinent positives/negatives) I, Lisa Roca, MD the attending physician have reviewed the labs noted below.  Labs Reviewed  COMPREHENSIVE METABOLIC PANEL - Abnormal; Notable for the following components:      Result Value   Chloride 100 (*)    AST 42 (*)    Alkaline Phosphatase 131 (*)    All other components within normal limits  URINALYSIS, COMPLETE (UACMP) WITH MICROSCOPIC - Abnormal; Notable for the following components:   Color, Urine YELLOW (*)    APPearance CLEAR (*)    Hgb urine dipstick MODERATE (*)    Squamous Epithelial / LPF 6-30 (*)    All other components within normal limits  LIPASE, BLOOD  CBC  TROPONIN I    ____________________________________________    EKG I, Lisa Roca, MD, the attending physician have personally viewed and interpreted all ECGs.  89 bpm.  Normal sinus rhythm.  Narrow QRS.  Normal axis.  Nonspecific ST and T wave ____________________________________________  RADIOLOGY All Xrays were viewed by me.  Imaging interpreted by  Radiologist, and I, Lisa Roca, MD the attending physician have reviewed the radiologist interpretation noted below.  Ultrasound right upper quadrant:  FINDINGS: Gallbladder:  No gallstones or wall thickening visualized. No sonographic Murphy sign noted by sonographer.  Common bile duct:  Diameter: 3.0 mm.  Liver:  2 cm oval hyperechoic mass over the right lobe slightly larger compared to the prior CT scan compatible with a hemangioma. Within normal limits in parenchymal echogenicity. Portal vein is patent on color Doppler imaging with normal direction of blood flow towards the liver.  IMPRESSION: No acute findings.  2 cm hemangioma right lobe of the liver. __________________________________________  PROCEDURES  Procedure(s) performed: None  Critical Care performed: None   ____________________________________________  ED COURSE / ASSESSMENT AND PLAN  Pertinent labs & imaging results that were available during my care of the patient were reviewed by me and considered in my medical decision making (see chart for details).  Patient presented with 2 episodes of epigastric pain that lasted for several hours.  Symptoms are located most suspicious for gastritis or GERD, but also consider pancreatitis or intestinal or biliary colic.  Less suspicious for cardiac etiology.  EKG is overall reassuring.  Symptoms are much less now than they were at their worst earlier today.  Laboratory studies are overall reassuring.  We discussed obtaining right upper quadrant ultrasound which was ultimately reassuring for no sign of gallstones.  We did discuss the incidental finding of the liver hemangioma.  She can follow-up with her primary care doctor.  We discussed conservative management for GERD/gastritis.  At this point I am not recommending CT imaging and she understands and is comfortable with this plan of management.  DIFFERENTIAL DIAGNOSIS: Differential diagnosis  includes, but is not limited to, biliary disease (biliary colic, acute cholecystitis, cholangitis, choledocholithiasis, etc), intrathoracic causes for epigastric abdominal pain including ACS, gastritis, duodenitis, pancreatitis, small bowel or large bowel obstruction, abdominal aortic aneurysm, hernia, and gastritis.  CONSULTATIONS:   None   Patient / Family / Caregiver informed of clinical course, medical decision-making process, and agree with plan.   I discussed return precautions, follow-up instructions, and discharge instructions with patient and/or family.  Discharge Instructions : You are evaluated for upper abdominal pain, and although no certain cause was found, your exam and evaluation are overall reassuring in the emergency department today.  As we discussed, I am most suspicious of acid reflux/GERD/gastritis.  Avoid fatty, spicy, acidic or citrus, caffeine, or soda for the next 1-2 weeks.  Take over-the-counter Pepcid 20 mg daily for about 2 weeks.  You may try over-the-counter Maalox, use as directed on labeling for immediate symptoms.  Return to the emergency department immediately for any worsening condition including uncontrolled pain, black or bloody stool, vomiting blood, fever, chest pain or trouble breathing, or any other symptoms concerning to you.    ___________________________________________   FINAL CLINICAL IMPRESSION(S) / ED DIAGNOSES   Final diagnoses:  Acute epigastric pain      ___________________________________________        Note: This dictation was prepared with Dragon dictation. Any transcriptional errors that result from this process are unintentional    Lisa Roca, MD 06/18/17 [phone]

## 2017-06-18 NOTE — ED Triage Notes (Signed)
Pt c/o upper abdominal pain that also seems in RUQ and right back.  No vomiting but is nauseated. Pain takes breath away.  No fevers. No urinary problems.  Skin warm and dry. Respirations unlabored but appears in pain.

## 2017-07-07 ENCOUNTER — Encounter: Payer: Self-pay | Admitting: Obstetrics and Gynecology

## 2017-07-10 ENCOUNTER — Telehealth: Payer: Self-pay | Admitting: Obstetrics and Gynecology

## 2017-07-10 NOTE — Telephone Encounter (Signed)
The patient called and stated that she would like to be seen sooner her injection was rescheduled due to inclement weather. Please advise.

## 2017-07-13 ENCOUNTER — Ambulatory Visit: Payer: Managed Care, Other (non HMO) | Admitting: Obstetrics and Gynecology

## 2017-07-20 ENCOUNTER — Ambulatory Visit (INDEPENDENT_AMBULATORY_CARE_PROVIDER_SITE_OTHER): Payer: Managed Care, Other (non HMO) | Admitting: Obstetrics and Gynecology

## 2017-07-20 ENCOUNTER — Encounter: Payer: Self-pay | Admitting: Obstetrics and Gynecology

## 2017-07-20 DIAGNOSIS — R635 Abnormal weight gain: Secondary | ICD-10-CM

## 2017-07-20 NOTE — Telephone Encounter (Signed)
Pt came in 07/20/17

## 2017-08-14 ENCOUNTER — Encounter: Payer: Self-pay | Admitting: Obstetrics and Gynecology

## 2017-08-15 ENCOUNTER — Encounter: Payer: Self-pay | Admitting: Obstetrics and Gynecology

## 2017-08-17 ENCOUNTER — Ambulatory Visit: Payer: Managed Care, Other (non HMO)

## 2017-09-07 ENCOUNTER — Encounter: Payer: Self-pay | Admitting: Obstetrics and Gynecology

## 2017-09-21 ENCOUNTER — Other Ambulatory Visit: Payer: Self-pay | Admitting: Obstetrics and Gynecology

## 2017-09-23 ENCOUNTER — Encounter: Payer: Self-pay | Admitting: Obstetrics and Gynecology

## 2017-10-21 ENCOUNTER — Other Ambulatory Visit: Payer: Self-pay | Admitting: Obstetrics and Gynecology

## 2017-10-22 ENCOUNTER — Encounter: Payer: Self-pay | Admitting: Obstetrics and Gynecology

## 2017-10-22 ENCOUNTER — Ambulatory Visit: Payer: Managed Care, Other (non HMO) | Admitting: Obstetrics and Gynecology

## 2017-10-22 VITALS — BP 143/71 | HR 85 | Ht 68.0 in | Wt 244.9 lb

## 2017-10-22 DIAGNOSIS — R140 Abdominal distension (gaseous): Secondary | ICD-10-CM | POA: Diagnosis not present

## 2017-10-22 DIAGNOSIS — E669 Obesity, unspecified: Secondary | ICD-10-CM

## 2017-10-22 MED ORDER — CYANOCOBALAMIN 1000 MCG/ML IJ SOLN: 1000.0000 ug | INTRAMUSCULAR | 1 refills | Status: DC

## 2017-10-22 NOTE — Progress Notes (Signed)
Subjective:  Linda Jimenez is a 42 y.o. G2P2 at Unknown being seen today for weight loss management- initial visit.  Patient reports General ROS: positive for  - bloating and water retension and reports frequent awakenings at night. Can't go back to sleep and is napping in car at lunch at work.  abuse and marijuana use. Past evaluation has included: metabolic profile, hemoglobin A1c, thyroid panel  Past treatment has included: small frequent feedings, nutritional supplement, vitamin supplement,  antidepressant, vitamin B-12 injections, appetite suppressant, exercise management and discontinuation of medication.  The following portions of the patient's history were reviewed and updated as appropriate: allergies, current medications, past family history, past medical history, past social history, past surgical history and problem list.   Objective:   Vitals:   10/22/17 0832  BP: (!) 143/71  Pulse: 85  Weight: 244 lb 14.4 oz (111.1 kg)  Height: 5\' 8"  (1.727 m)    General:  Alert, oriented and cooperative. Patient is in no acute distress.  :   :   :   :   :   :   PE: Well groomed female in no current distress,   Mental Status: Normal mood and affect. Normal behavior. Normal judgment and thought content.   Current BMI: Body mass index is 37.24 kg/m.   Assessment and Plan:  Obesity  Sleep disturbance  Plan: low carb, High protein diet RX for adipex 37.5 mg daily and B12 1029mcg.ml monthly, to start now with first injection given at today's visit. Reviewed side-effects common to both medications and expected outcomes. Increase daily water intake to at least 8 bottle a day, every day.  Goal is to reduse weight by 10% by end of three months, and will re-evaluate then.  Triazolam 0.125mg  nightly added. RTC in 4 weeks for Nurse visit to check weight & BP, and get next B12 injections.    Please refer to After Visit Summary for other counseling recommendations.    [place],  Melody N, CNM   Melody [place], CNM      Consider the Low Glycemic Index Diet and 6 smaller meals daily .  This boosts your metabolism and regulates your sugars:   Use the protein bar by [employer] because they have lots of fiber in them  Find the low carb flatbreads, tortillas and pita breads for sandwiches:  Joseph's makes a pita bread and a flat bread , available at [facility] and [employer]; [place] makes a low carb flatbread available at [employer] and HT that is 9 net carbs and 100 cal [place] makes a low carb whole wheat tortilla available at [employer] most grocery stores with 6 net carbs and 210 cal  [place] yogurt can still have a lot of carbs .  Dannon Light N fit has 80 cal and 8 carbs

## 2017-11-20 ENCOUNTER — Ambulatory Visit (INDEPENDENT_AMBULATORY_CARE_PROVIDER_SITE_OTHER): Payer: Managed Care, Other (non HMO) | Admitting: Obstetrics and Gynecology

## 2017-11-20 ENCOUNTER — Encounter: Payer: Self-pay | Admitting: Obstetrics and Gynecology

## 2017-11-20 VITALS — BP 151/95 | HR 95 | Wt 239.5 lb

## 2017-11-20 DIAGNOSIS — E669 Obesity, unspecified: Secondary | ICD-10-CM

## 2017-11-20 MED ORDER — CYANOCOBALAMIN 1000 MCG/ML IJ SOLN
1000.0000 ug | Freq: Once | INTRAMUSCULAR | Status: AC
  Administered 2017-11-20: 1000 ug via INTRAMUSCULAR

## 2017-11-20 NOTE — Progress Notes (Signed)
Pt is here for wt, bp check, b-12 inj She denies any s/e, pt BP is up due to daughter having some issues  11/20/17 wt- 239.5lb 10/22/17 wt- 244lb

## 2017-12-21 ENCOUNTER — Ambulatory Visit: Payer: Managed Care, Other (non HMO) | Admitting: Obstetrics and Gynecology

## 2018-01-03 ENCOUNTER — Encounter: Payer: Self-pay | Admitting: Obstetrics and Gynecology

## 2018-01-05 ENCOUNTER — Encounter: Payer: Self-pay | Admitting: *Deleted

## 2018-03-04 ENCOUNTER — Other Ambulatory Visit: Payer: Self-pay | Admitting: Unknown Physician Specialty

## 2018-03-04 DIAGNOSIS — H93A9 Pulsatile tinnitus, unspecified ear: Secondary | ICD-10-CM

## 2018-03-10 ENCOUNTER — Ambulatory Visit
Admission: RE | Admit: 2018-03-10 | Discharge: 2018-03-10 | Disposition: A | Discharge status: Home / Self Care | Payer: Managed Care, Other (non HMO) | Source: Ambulatory Visit | Attending: Unknown Physician Specialty | Admitting: Unknown Physician Specialty

## 2018-03-10 DIAGNOSIS — H93A2 Pulsatile tinnitus, left ear: Secondary | ICD-10-CM | POA: Diagnosis present

## 2018-03-10 DIAGNOSIS — I6523 Occlusion and stenosis of bilateral carotid arteries: Secondary | ICD-10-CM | POA: Insufficient documentation

## 2018-03-10 DIAGNOSIS — H93A9 Pulsatile tinnitus, unspecified ear: Secondary | ICD-10-CM

## 2018-03-25 ENCOUNTER — Ambulatory Visit (INDEPENDENT_AMBULATORY_CARE_PROVIDER_SITE_OTHER): Payer: Managed Care, Other (non HMO) | Admitting: Vascular Surgery

## 2018-03-25 ENCOUNTER — Encounter (INDEPENDENT_AMBULATORY_CARE_PROVIDER_SITE_OTHER): Payer: Self-pay | Admitting: Vascular Surgery

## 2018-03-25 DIAGNOSIS — R0989 Other specified symptoms and signs involving the circulatory and respiratory systems: Secondary | ICD-10-CM | POA: Insufficient documentation

## 2018-03-25 DIAGNOSIS — H93A2 Pulsatile tinnitus, left ear: Secondary | ICD-10-CM | POA: Diagnosis not present

## 2018-03-25 NOTE — Progress Notes (Signed)
MRN : [id number]  Linda Jimenez is a 42 y.o. (1975/10/12) female who presents with chief complaint of  Chief Complaint  Patient presents with  . New Patient (Initial Visit)    Tinnitus  .  History of Present Illness:   The patient is seen for evaluation of left sided pulsatile tinnitus. The patient describes it as "wooshing" sound that is loudest when laying with her left head on a pillow.  There was no syncope or loss of consciousness.  The episodes are consistent day to day.  It began a month or so ago and is associated with an "ear infection".  No past experience with tinnitus in the past.  There is no history of TIA symptoms or focal motor deficits. There is no prior documented CVA.  The patient was not taking enteric-coated aspirin 81 mg daily at the time.  There is no history of migraine headaches or prior diagnosis of ocular migraine. There is no history of seizures.  The patient denies a history of coronary artery disease, no recent episodes of angina or shortness of breath. The patient denies PAD or claudication symptoms. There is a history of hyperlipidemia which is being treated with a statin.    Current Meds  Medication Sig  . cyanocobalamin (,VITAMIN B-12,) 1000 MCG/ML injection Inject 1 mL (1,000 mcg total) into the muscle every 30 (thirty) days.  Marland Kitchen FLUoxetine (PROZAC) 40 MG capsule TAKE ONE CAPSULE BY MOUTH DAILY  . magic mouthwash w/lidocaine SOLN Take 5 mLs by mouth 3 (three) times daily as needed for mouth pain.  . Vitamin D, Ergocalciferol, (DRISDOL) 50000 units CAPS capsule Take 1 capsule (50,000 Units total) by mouth 2 (two) times a week.    Past Medical History:  Diagnosis Date  . Endometriosis   . Obesity   . Vitamin D deficiency disease     Past Surgical History:  Procedure Laterality Date  . ABDOMINAL HYSTERECTOMY    . BREAST BIOPSY Left September 2014   Left breast, 11:00 core biopsy Fibroadenoma  . COLONOSCOPY N/A 05/14/2015   Procedure:  COLONOSCOPY;  Surgeon: Hulen Luster, MD;  Location: [facility] ENDOSCOPY;  Service: Gastroenterology;  Laterality: N/A;  . PLACEMENT OF BREAST IMPLANTS  2004   Dr. Cathie Hoops    Social History Social History   Tobacco Use  . Smoking status: Never Smoker  . Smokeless tobacco: Never Used  Substance Use Topics  . Alcohol use: Yes    Comment: occas  . Drug use: No    Family History History reviewed. No pertinent family history. No family history of bleeding/clotting disorders, porphyria or autoimmune disease   No Known Allergies   REVIEW OF SYSTEMS (Negative unless checked)  Constitutional: [] Weight loss  [] Fever  [] Chills Cardiac: [] Chest pain   [] Chest pressure   [] Palpitations   [] Shortness of breath when laying flat   [] Shortness of breath with exertion. Vascular:  [] Pain in legs with walking   [] Pain in legs at rest  [] History of DVT   [] Phlebitis   [] Swelling in legs   [] Varicose veins   [] Non-healing ulcers Pulmonary:   [] Uses home oxygen   [] Productive cough   [] Hemoptysis   [] Wheeze  [] COPD   [] Asthma Neurologic:  [] Dizziness   [] Seizures   [] History of stroke   [] History of TIA  [] Aphasia   [] Vissual changes   [] Weakness or numbness in arm   [] Weakness or numbness in leg Musculoskeletal:   [] Joint swelling   [] Joint pain   [] Low back  pain Hematologic:  [] Easy bruising  [] Easy bleeding   [] Hypercoagulable state   [] Anemic Gastrointestinal:  [] Diarrhea   [] Vomiting  [] Gastroesophageal reflux/heartburn   [] Difficulty swallowing. Genitourinary:  [] Chronic kidney disease   [] Difficult urination  [] Frequent urination   [] Blood in urine Skin:  [] Rashes   [] Ulcers  Psychological:  [] History of anxiety   []  History of major depression.  Physical Examination  Vitals:   03/25/18 1113  BP: (!) 152/93  Pulse: 87  Resp: 17  Weight: 252 lb (114.3 kg)  Height: 5\' 8"  (1.727 m)   Body mass index is 38.32 kg/m. Gen: WD/WN, NAD Head: [place]/AT, No temporalis wasting.  Ear/Nose/Throat: Hearing  grossly intact, nares w/o erythema or drainage, poor dentition Eyes: PER, EOMI, sclera nonicteric.  Neck: Supple, no masses.  No bruit or JVD.  Pulmonary:  Good air movement, clear to auscultation bilaterally, no use of accessory muscles.  Cardiac: RRR, normal S1, S2, no Murmurs. Vascular: no carotid bruits Vessel Right Left  Radial Palpable Palpable  Ulnar Palpable Palpable  Brachial Palpable Palpable  Carotid Palpable Palpable  Gastrointestinal: soft, non-distended. No guarding/no peritoneal signs.  Musculoskeletal: M/S 5/5 throughout.  No deformity or atrophy.  Neurologic: CN 2-12 intact. Pain and light touch intact in extremities.  Symmetrical.  Speech is fluent. Motor exam as listed above. Psychiatric: Judgment intact, Mood & affect appropriate for pt's clinical situation. Dermatologic: No rashes or ulcers noted.  No changes consistent with cellulitis. Lymph : No Cervical lymphadenopathy, no lichenification or skin changes of chronic lymphedema.  CBC Lab Results  Component Value Date   WBC 7.1 06/18/2017   HGB 12.8 06/18/2017   HCT 38.0 06/18/2017   MCV 88.1 06/18/2017   PLT 357 06/18/2017    BMET    Component Value Date/Time   NA 135 06/18/2017 1044   NA 138 03/19/2017 0920   K 3.7 06/18/2017 1044   CL 100 (L) 06/18/2017 1044   CO2 26 06/18/2017 1044   GLUCOSE 99 06/18/2017 1044   BUN 12 06/18/2017 1044   BUN 12 03/19/2017 0920   CREATININE 0.61 06/18/2017 1044   CALCIUM 9.1 06/18/2017 1044   GFRNONAA >60 06/18/2017 1044   GFRAA >60 06/18/2017 1044   CrCl cannot be calculated (Patient's most recent lab result is older than the maximum 21 days allowed.).  COAG No results found for: INR, PROTIME  Radiology US Carotid Bilateral  Result Date: 03/10/2018 CLINICAL DATA:  Left pulsatile tinnitus EXAM: BILATERAL CAROTID DUPLEX ULTRASOUND TECHNIQUE: Pearline Cables scale imaging, color Doppler and duplex ultrasound were performed of bilateral carotid and vertebral arteries in  the neck. COMPARISON:  None. FINDINGS: Criteria: Quantification of carotid stenosis is based on velocity parameters that correlate the residual internal carotid diameter with NASCET-based stenosis levels, using the diameter of the distal internal carotid lumen as the denominator for stenosis measurement. The following velocity measurements were obtained: RIGHT ICA: 112 cm/sec CCA: 606 cm/sec SYSTOLIC ICA/CCA RATIO:  1.1 ECA: 164 cm/sec LEFT ICA: 112 cm/sec CCA: 301 cm/sec SYSTOLIC ICA/CCA RATIO:  1.0 ECA: 157 cm/sec RIGHT CAROTID ARTERY: Mild smooth mixed plaque in the bulb. Low resistance internal carotid Doppler pattern. RIGHT VERTEBRAL ARTERY:  Antegrade. LEFT CAROTID ARTERY: Minimal intimal thickening in the bulb. Low resistance internal carotid Doppler. LEFT VERTEBRAL ARTERY:  Antegrade. IMPRESSION: Less than 50% stenosis in the right and left internal carotid arteries. Electronically Signed   By: Marybelle Killings M.D.   On: 03/10/2018 14:04     Assessment/Plan 1. Pulsatile tinnitus of left ear  Recommend:  Given the patient's asymptomatic normal carotid duplex (minimal plaque found and the distal wave form is normal) no further invasive testing or surgery at this time.  CT angiogram was discussed but we will hold off for now.  Continue antiplatelet therapy as prescribed Continue management of CAD, HTN and Hyperlipidemia  Healthy heart diet,  encouraged exercise at least 4 times per week  Given the minimal bilateral carotid stenosis in association with the patient's age the patient will follow up PRN.    The patient is told that if symptoms of  Tinnitus worsen or pain develops or a TIA should occur then she should go to the ER and I should be notified, as this would change the management course and a CT angiogram would be obtained.  The patient voices understanding.     Hortencia Pilar, MD  03/25/2018 12:47 PM

## 2018-03-25 NOTE — Patient Instructions (Signed)
t becan amout a monthorso ago

## 2018-04-28 ENCOUNTER — Other Ambulatory Visit: Payer: Self-pay | Admitting: *Deleted

## 2018-04-28 MED ORDER — FLUOXETINE HCL 40 MG PO CAPS: 40.0000 mg | ORAL_CAPSULE | Freq: Every day | ORAL | 5 refills | Status: DC

## 2018-05-12 ENCOUNTER — Ambulatory Visit (INDEPENDENT_AMBULATORY_CARE_PROVIDER_SITE_OTHER): Payer: Managed Care, Other (non HMO) | Admitting: Obstetrics and Gynecology

## 2018-05-12 ENCOUNTER — Encounter: Payer: Self-pay | Admitting: Obstetrics and Gynecology

## 2018-05-12 VITALS — BP 142/91 | HR 71 | Ht 68.0 in | Wt 248.8 lb

## 2018-05-12 DIAGNOSIS — Z1231 Encounter for screening mammogram for malignant neoplasm of breast: Secondary | ICD-10-CM | POA: Diagnosis not present

## 2018-05-12 DIAGNOSIS — E559 Vitamin D deficiency, unspecified: Secondary | ICD-10-CM

## 2018-05-12 DIAGNOSIS — E669 Obesity, unspecified: Secondary | ICD-10-CM

## 2018-05-12 DIAGNOSIS — K529 Noninfective gastroenteritis and colitis, unspecified: Secondary | ICD-10-CM

## 2018-05-12 DIAGNOSIS — R309 Painful micturition, unspecified: Secondary | ICD-10-CM

## 2018-05-12 DIAGNOSIS — Z23 Encounter for immunization: Secondary | ICD-10-CM

## 2018-05-12 MED ORDER — FLUOXETINE HCL 40 MG PO CAPS: 40.0000 mg | ORAL_CAPSULE | Freq: Every day | ORAL | 5 refills | Status: DC

## 2018-05-12 NOTE — Progress Notes (Signed)
  Subjective:     Patient ID: Linda Jimenez, female   DOB: [birth year], 42 y.o.   MRN: [id number]  HPI Started keto diet 3 weeks ago and feels fine, has lost 7 #s to date. Cutting out pasta and carbs has helped ulcerative colitis.Not constipated. Actually had more frequent stools at first, but things are normal now.    Still taking depression medication and feels better with it.    Review of Systems  All other systems reviewed and are negative.      Objective:   Physical Exam A&Ox4 Well groomed female in no distress Blood pressure (!) 142/91, pulse 71, height 5\' 8"  (1.727 m), weight 248 lb 12.8 oz (112.9 kg).  Body mass index is 37.83 kg/m.    Assessment:     Obesity Depression Ulcerative colitis Vit D deficiency Needs flu vaccine    Plan:     Labs obtained- will follow up according Will return in 1 month for AE as it is past due B12 injection given Flu vaccine given.   Murrel Freet,CNM

## 2018-05-13 LAB — LIPID PANEL
CHOLESTEROL TOTAL: 178 mg/dL (ref 100–199)
Chol/HDL Ratio: 3.4 ratio (ref 0.0–4.4)
HDL: 52 mg/dL (ref >39)
LDL Calculated: 99 mg/dL (ref 0–99)
TRIGLYCERIDES: 133 mg/dL (ref 0–149)
VLDL Cholesterol Cal: 27 mg/dL (ref 5–40)

## 2018-05-13 LAB — CBC
HEMOGLOBIN: 11.9 g/dL (ref 11.1–15.9)
Hematocrit: 36.4 % (ref 34.0–46.6)
MCH: 29 pg (ref 26.6–33.0)
MCHC: 32.7 g/dL (ref 31.5–35.7)
MCV: 89 fL (ref 79–97)
Platelets: 352 10*3/uL (ref 150–450)
RBC: 4.11 x10E6/uL (ref 3.77–5.28)
RDW: 13.1 % (ref 12.3–15.4)
WBC: 5.9 10*3/uL (ref 3.4–10.8)

## 2018-05-13 LAB — THYROID PANEL WITH TSH
FREE THYROXINE INDEX: 1.8 (ref 1.2–4.9)
T3 Uptake Ratio: 27 % (ref 24–39)
T4, Total: 6.5 ug/dL (ref 4.5–12.0)
TSH: 2.01 u[IU]/mL (ref 0.450–4.500)

## 2018-05-13 LAB — COMPREHENSIVE METABOLIC PANEL
ALBUMIN: 4 g/dL (ref 3.5–5.5)
ALK PHOS: 120 IU/L — AB (ref 39–117)
ALT: 12 IU/L (ref 0–32)
AST: 12 IU/L (ref 0–40)
Albumin/Globulin Ratio: 1.5 (ref 1.2–2.2)
BUN / CREAT RATIO: 28 — AB (ref 9–23)
BUN: 15 mg/dL (ref 6–24)
Bilirubin Total: <0.2 mg/dL (ref 0.0–1.2)
CO2: 26 mmol/L (ref 20–29)
CREATININE: 0.54 mg/dL — AB (ref 0.57–1.00)
Calcium: 9.1 mg/dL (ref 8.7–10.2)
Chloride: 100 mmol/L (ref 96–106)
GFR calc Af Amer: 133 mL/min/{1.73_m2} (ref >59)
GFR, EST NON AFRICAN AMERICAN: 115 mL/min/{1.73_m2} (ref >59)
GLOBULIN, TOTAL: 2.6 g/dL (ref 1.5–4.5)
Glucose: 76 mg/dL (ref 65–99)
Potassium: 4.2 mmol/L (ref 3.5–5.2)
SODIUM: 140 mmol/L (ref 134–144)
Total Protein: 6.6 g/dL (ref 6.0–8.5)

## 2018-05-13 LAB — VITAMIN D 25 HYDROXY (VIT D DEFICIENCY, FRACTURES): VIT D 25 HYDROXY: 19.2 ng/mL — AB (ref 30.0–100.0)

## 2018-05-13 LAB — FERRITIN: Ferritin: 40 ng/mL (ref 15–150)

## 2018-05-14 ENCOUNTER — Other Ambulatory Visit: Payer: Self-pay | Admitting: Obstetrics and Gynecology

## 2018-05-14 ENCOUNTER — Other Ambulatory Visit: Payer: Self-pay | Admitting: *Deleted

## 2018-05-14 MED ORDER — PHENAZOPYRIDINE HCL 200 MG PO TABS: 200.0000 mg | ORAL_TABLET | Freq: Three times a day (TID) | ORAL | 1 refills | Status: DC | PRN

## 2018-05-14 MED ORDER — VITAMIN D (ERGOCALCIFEROL) 1.25 MG (50000 UNIT) PO CAPS: 50000.0000 [IU] | ORAL_CAPSULE | ORAL | 1 refills | Status: DC

## 2018-05-17 ENCOUNTER — Other Ambulatory Visit: Payer: Self-pay

## 2018-05-17 DIAGNOSIS — R309 Painful micturition, unspecified: Secondary | ICD-10-CM

## 2018-05-17 NOTE — Addendum Note (Signed)
Addended by: Lorinda Creed on: 05/17/2018 01:25 PM   Modules accepted: Orders

## 2018-05-18 ENCOUNTER — Encounter: Payer: Self-pay | Admitting: Obstetrics and Gynecology

## 2018-05-18 ENCOUNTER — Ambulatory Visit (INDEPENDENT_AMBULATORY_CARE_PROVIDER_SITE_OTHER): Payer: Managed Care, Other (non HMO) | Admitting: Obstetrics and Gynecology

## 2018-05-18 VITALS — BP 142/90 | HR 98 | Ht 68.0 in | Wt 248.0 lb

## 2018-05-18 DIAGNOSIS — R309 Painful micturition, unspecified: Secondary | ICD-10-CM | POA: Diagnosis not present

## 2018-05-18 MED ORDER — NITROFURANTOIN MONOHYD MACRO 100 MG PO CAPS: 100.0000 mg | ORAL_CAPSULE | Freq: Two times a day (BID) | ORAL | 1 refills | Status: DC

## 2018-05-18 NOTE — Progress Notes (Signed)
  Subjective:     Patient ID: Linda Jimenez, female   DOB: 27-Jun-1976, 42 y.o.   MRN: [id number]  HPI Here with c/o pain with urination for 4  Days after having sex in the shower, also noticing blood in urine.Took AZO with some relief.   Review of Systems  All other systems reviewed and are negative.      Objective:   Physical Exam A&Ox4 Well groomed female Blood pressure (!) 142/90, pulse 98, height 5\' 8"  (1.727 m), weight 248 lb (112.5 kg). Urinalysis    Component Value Date/Time   COLORURINE YELLOW (A) 06/18/2017 1044   APPEARANCEUR CLEAR (A) 06/18/2017 1044   LABSPEC 1.015 06/18/2017 1044   PHURINE 6.0 06/18/2017 1044   GLUCOSEU NEGATIVE 06/18/2017 1044   HGBUR MODERATE (A) 06/18/2017 1044   BILIRUBINUR NEGATIVE 06/18/2017 1044   KETONESUR NEGATIVE 06/18/2017 1044   PROTEINUR NEGATIVE 06/18/2017 1044   NITRITE NEGATIVE 06/18/2017 1044   LEUKOCYTESUR NEGATIVE 06/18/2017 1044       Assessment:     dysuria    Plan:     Urine sent for cultre

## 2018-05-20 LAB — URINE CULTURE

## 2018-06-07 ENCOUNTER — Telehealth: Payer: Self-pay | Admitting: Obstetrics and Gynecology

## 2018-06-07 NOTE — Telephone Encounter (Signed)
The patient called and stated that she would like to speak with Melody if possible. Please advise.

## 2018-06-08 ENCOUNTER — Other Ambulatory Visit: Payer: Self-pay | Admitting: Obstetrics and Gynecology

## 2018-06-08 DIAGNOSIS — R300 Dysuria: Secondary | ICD-10-CM

## 2018-06-08 NOTE — Telephone Encounter (Signed)
Pt was sent mychart message

## 2018-06-09 ENCOUNTER — Other Ambulatory Visit: Payer: Self-pay | Admitting: Obstetrics and Gynecology

## 2018-06-09 MED ORDER — UROGESIC-BLUE 81.6 MG PO TABS: 1.0000 | ORAL_TABLET | Freq: Four times a day (QID) | ORAL | 1 refills | Status: DC | PRN

## 2018-06-10 ENCOUNTER — Other Ambulatory Visit: Payer: Managed Care, Other (non HMO)

## 2018-06-10 ENCOUNTER — Other Ambulatory Visit: Payer: Self-pay

## 2018-06-10 DIAGNOSIS — R300 Dysuria: Secondary | ICD-10-CM

## 2018-06-10 MED ORDER — CIPROFLOXACIN HCL 500 MG PO TABS: 500.0000 mg | ORAL_TABLET | Freq: Two times a day (BID) | ORAL | 0 refills | Status: DC

## 2018-06-12 LAB — URINE CULTURE

## 2018-07-20 ENCOUNTER — Ambulatory Visit: Payer: Managed Care, Other (non HMO) | Admitting: Urology

## 2018-07-21 ENCOUNTER — Encounter: Payer: Self-pay | Admitting: Urology

## 2018-08-09 ENCOUNTER — Encounter: Payer: Self-pay | Admitting: *Deleted

## 2018-08-26 ENCOUNTER — Encounter: Payer: Managed Care, Other (non HMO) | Admitting: Obstetrics and Gynecology

## 2018-09-03 ENCOUNTER — Ambulatory Visit: Payer: Managed Care, Other (non HMO) | Admitting: Obstetrics and Gynecology

## 2018-09-03 ENCOUNTER — Encounter: Payer: Self-pay | Admitting: Obstetrics and Gynecology

## 2018-09-03 VITALS — BP 167/85 | HR 84 | Ht 68.0 in | Wt 246.8 lb

## 2018-09-03 DIAGNOSIS — E559 Vitamin D deficiency, unspecified: Secondary | ICD-10-CM | POA: Diagnosis not present

## 2018-09-03 DIAGNOSIS — Z6837 Body mass index (BMI) 37.0-37.9, adult: Secondary | ICD-10-CM | POA: Diagnosis not present

## 2018-09-03 DIAGNOSIS — Z01419 Encounter for gynecological examination (general) (routine) without abnormal findings: Secondary | ICD-10-CM

## 2018-09-03 NOTE — Patient Instructions (Signed)
 Preventive Care 18-39 Years, Female Preventive care refers to lifestyle choices and visits with your health care provider that can promote health and wellness. What does preventive care include?   A yearly physical exam. This is also called an annual well check.  Dental exams once or twice a year.  Routine eye exams. Ask your health care provider how often you should have your eyes checked.  Personal lifestyle choices, including: ? Daily care of your teeth and gums. ? Regular physical activity. ? Eating a healthy diet. ? Avoiding tobacco and drug use. ? Limiting alcohol use. ? Practicing safe sex. ? Taking vitamin and mineral supplements as recommended by your health care provider. What happens during an annual well check? The services and screenings done by your health care provider during your annual well check will depend on your age, overall health, lifestyle risk factors, and family history of disease. Counseling Your health care provider may ask you questions about your:  Alcohol use.  Tobacco use.  Drug use.  Emotional well-being.  Home and relationship well-being.  Sexual activity.  Eating habits.  Work and work environment.  Method of birth control.  Menstrual cycle.  Pregnancy history. Screening You may have the following tests or measurements:  Height, weight, and BMI.  Diabetes screening. This is done by checking your blood sugar (glucose) after you have not eaten for a while (fasting).  Blood pressure.  Lipid and cholesterol levels. These may be checked every 5 years starting at age 20.  Skin check.  Hepatitis C blood test.  Hepatitis B blood test.  Sexually transmitted disease (STD) testing.  BRCA-related cancer screening. This may be done if you have a family history of breast, ovarian, tubal, or peritoneal cancers.  Pelvic exam and Pap test. This may be done every 3 years starting at age 21. Starting at age 30, this may be done  every 5 years if you have a Pap test in combination with an HPV test. Discuss your test results, treatment options, and if necessary, the need for more tests with your health care provider. Vaccines Your health care provider may recommend certain vaccines, such as:  Influenza vaccine. This is recommended every year.  Tetanus, diphtheria, and acellular pertussis (Tdap, Td) vaccine. You may need a Td booster every 10 years.  Varicella vaccine. You may need this if you have not been vaccinated.  HPV vaccine. If you are 26 or younger, you may need three doses over 6 months.  Measles, mumps, and rubella (MMR) vaccine. You may need at least one dose of MMR. You may also need a second dose.  Pneumococcal 13-valent conjugate (PCV13) vaccine. You may need this if you have certain conditions and were not previously vaccinated.  Pneumococcal polysaccharide (PPSV23) vaccine. You may need one or two doses if you smoke cigarettes or if you have certain conditions.  Meningococcal vaccine. One dose is recommended if you are age 19-21 years and a first-year college student living in a residence hall, or if you have one of several medical conditions. You may also need additional booster doses.  Hepatitis A vaccine. You may need this if you have certain conditions or if you travel or work in places where you may be exposed to hepatitis A.  Hepatitis B vaccine. You may need this if you have certain conditions or if you travel or work in places where you may be exposed to hepatitis B.  Haemophilus influenzae type b (Hib) vaccine. You may need this if   you have certain risk factors. Talk to your health care provider about which screenings and vaccines you need and how often you need them. This information is not intended to replace advice given to you by your health care provider. Make sure you discuss any questions you have with your health care provider. Document Released: 08/12/2001 Document Revised:  01/27/2017 Document Reviewed: 04/17/2015 Elsevier Interactive Patient Education  2019 Elsevier Inc.  

## 2018-09-03 NOTE — Progress Notes (Signed)
Subjective:   Linda Jimenez is a 43 y.o. G48P2 Caucasian female here for a routine well-woman exam.  No LMP recorded (lmp unknown). Patient has had a hysterectomy.    Current complaints: none PCP: me       Does need vit D lab repeated  Social History: Sexual: heterosexual Marital Status: married Living situation: with family Occupation: Doctor, hospital Tobacco/alcohol: no tobacco use Illicit drugs: no history of illicit drug use  The following portions of the patient's history were reviewed and updated as appropriate: allergies, current medications, past family history, past medical history, past social history, past surgical history and problem list.  Past Medical History Past Medical History:  Diagnosis Date  . Endometriosis   . Obesity   . Vitamin D deficiency disease     Past Surgical History Past Surgical History:  Procedure Laterality Date  . ABDOMINAL HYSTERECTOMY    . BREAST BIOPSY Left September 2014   Left breast, 11:00 core biopsy Fibroadenoma  . COLONOSCOPY N/A 05/14/2015   Procedure: COLONOSCOPY;  Surgeon: Hulen Luster, MD;  Location: [facility] ENDOSCOPY;  Service: Gastroenterology;  Laterality: N/A;  . PLACEMENT OF BREAST IMPLANTS  2004   Dr. Cathie Hoops    Gynecologic History G2P2  No LMP recorded (lmp unknown). Patient has had a hysterectomy. Contraception: vasectomy Last Pap: 12/2016. Results were: normal Last mammogram: 03/2015. Results were: normal   Obstetric History OB History  Gravida Para Term Preterm AB Living  2 2       2   SAB TAB Ectopic Multiple Live Births               # Outcome Date GA Lbr Len/2nd Weight Sex Delivery Anes PTL Lv  2 Para           1 Para             Obstetric Comments  1st Menstrual Cycle:  10  1st Pregnancy:  21    Current Medications Current Outpatient Medications on File Prior to Visit  Medication Sig Dispense Refill  . FLUoxetine (PROZAC) 40 MG capsule Take 1 capsule (40 mg total) by mouth daily. 30 capsule 5  . Vitamin  D, Ergocalciferol, (DRISDOL) 1.25 MG (50000 UT) CAPS capsule Take 1 capsule (50,000 Units total) by mouth 2 (two) times a week. 30 capsule 1  . cyanocobalamin (,VITAMIN B-12,) 1000 MCG/ML injection Inject 1 mL (1,000 mcg total) into the muscle every 30 (thirty) days. (Patient not taking: Reported on 09/03/2018) 10 mL 1  . phentermine (ADIPEX-P) 37.5 MG tablet Take 1 tablet (37.5 mg total) by mouth daily before breakfast. (Patient not taking: Reported on 03/25/2018) 30 tablet 2   No current facility-administered medications on file prior to visit.     Review of Systems Patient denies any headaches, blurred vision, shortness of breath, chest pain, abdominal pain, problems with bowel movements, urination, or intercourse.  Objective:  BP (!) 167/85   Pulse 84   Ht 5\' 8"  (1.727 m)   Wt 246 lb 12.8 oz (111.9 kg)   LMP  (LMP Unknown)   BMI 37.53 kg/m  Physical Exam  General:  Well developed, well nourished, no acute distress. She is alert and oriented x3. Skin:  Warm and dry Neck:  Midline trachea, no thyromegaly or nodules Cardiovascular: Regular rate and rhythm, no murmur heard Lungs:  Effort normal, all lung fields clear to auscultation bilaterally Breasts:  No dominant palpable mass, retraction, or nipple discharge, bilateral breast implant without defect Abdomen:  Soft, non  tender, no hepatosplenomegaly or masses Pelvic:  External genitalia is normal in appearance.  The vagina is normal in appearance. The cervix is bulbous, no CMT.  Thin prep pap is not done . Uterus is surgically absent.  No adnexal masses or tenderness noted. Extremities:  No swelling or varicosities noted Psych:  She has a normal mood and affect  Assessment:   Healthy well-woman exam BMI 37 Vitamin d deficiency  Plan:  Vit d levels rechecked F/U 1 year for AE, or sooner if needed Mammogram ordered   Rockney Ghee, CNM

## 2018-09-04 LAB — VITAMIN D 25 HYDROXY (VIT D DEFICIENCY, FRACTURES): VIT D 25 HYDROXY: 21.8 ng/mL — AB (ref 30.0–100.0)

## 2018-09-14 ENCOUNTER — Other Ambulatory Visit: Payer: Self-pay | Admitting: Obstetrics and Gynecology

## 2018-09-14 DIAGNOSIS — Z01419 Encounter for gynecological examination (general) (routine) without abnormal findings: Secondary | ICD-10-CM

## 2018-12-13 ENCOUNTER — Other Ambulatory Visit: Payer: Self-pay

## 2018-12-13 MED ORDER — FLUOXETINE HCL 40 MG PO CAPS: 40.0000 mg | ORAL_CAPSULE | Freq: Every day | ORAL | 5 refills | Status: AC

## 2019-03-30 ENCOUNTER — Encounter: Payer: Managed Care, Other (non HMO) | Admitting: Obstetrics and Gynecology

## 2019-06-06 ENCOUNTER — Other Ambulatory Visit: Payer: Self-pay | Admitting: Internal Medicine

## 2019-06-06 DIAGNOSIS — F3341 Major depressive disorder, recurrent, in partial remission: Secondary | ICD-10-CM | POA: Insufficient documentation

## 2019-06-06 DIAGNOSIS — Z1231 Encounter for screening mammogram for malignant neoplasm of breast: Secondary | ICD-10-CM

## 2019-06-06 DIAGNOSIS — K5100 Ulcerative (chronic) pancolitis without complications: Secondary | ICD-10-CM | POA: Insufficient documentation

## 2019-07-27 ENCOUNTER — Other Ambulatory Visit: Payer: Self-pay

## 2019-07-27 ENCOUNTER — Encounter: Payer: Self-pay | Admitting: Gastroenterology

## 2019-07-27 ENCOUNTER — Ambulatory Visit (INDEPENDENT_AMBULATORY_CARE_PROVIDER_SITE_OTHER): Payer: Managed Care, Other (non HMO) | Admitting: Gastroenterology

## 2019-07-27 VITALS — BP 113/74 | HR 77 | Temp 97.8°F | Ht 68.0 in | Wt 235.8 lb

## 2019-07-27 DIAGNOSIS — G8929 Other chronic pain: Secondary | ICD-10-CM | POA: Diagnosis not present

## 2019-07-27 DIAGNOSIS — R1031 Right lower quadrant pain: Secondary | ICD-10-CM

## 2019-07-27 DIAGNOSIS — R140 Abdominal distension (gaseous): Secondary | ICD-10-CM

## 2019-07-27 NOTE — Progress Notes (Signed)
Gastroenterology Consultation  Referring Provider:     Idelle Crouch, MD Primary Care Physician:  Idelle Crouch, MD Primary Gastroenterologist:  Dr. Allen Norris     Reason for Consultation:     Proctitis        HPI:   Linda Jimenez is a 44 y.o. y/o female referred for consultation & management of proctitis by Dr. Doy Hutching, Leonie Douglas, MD.  This patient comes in today after being seen by Dr. Candace Cruise back in 2016 with a colonoscopy at that time that showed limited inflammation in the sigmoid and rectum.  Biopsies of this inflammation showed the patient to have acute inflammation without any signs of chronicity.  The pathology was reported as:  DIAGNOSIS:  A. ILEUM; COLD BIOPSY:  - SMALL INTESTINAL MUCOSA WITH PROMINENT LYMPHOID AGGREGATES, WITHIN  NORMAL LIMITS.   B. COLON, RIGHT; COLD BIOPSY:  - UNREMARKABLE COLONIC MUCOSA.   C. COLON, LEFT; COLD BIOPSY:  - MODERATE ACTIVE COLITIS.  - NEGATIVE FOR DYSPLASIA AND MALIGNANCY.   Comment:  The differential diagnosis for moderate active colitis includes biopsy adjacent to inflamed diverticular disease, infection, mucosal injury due to drug/medication, and ischemia. Correlation with endoscopic findings is required.   It does not appear that the patient has followed up with any GI doctors since that time.  The patient was recommended at that time to have a repeat colonoscopy in 1 year. The patient reports that she was not told that she needed a repeat colonoscopy when she last saw her her gastroenterologist.  She denies any rectal bleeding or diarrhea. The patient reports that she has bloating and abdominal pain usually after eating dairy products and states that the symptoms usually go away after she passes gas or moves her bowels.  Past Medical History:  Diagnosis Date  . Endometriosis   . Obesity   . Vitamin D deficiency disease     Past Surgical History:  Procedure Laterality Date  . ABDOMINAL HYSTERECTOMY    . BREAST BIOPSY Left  September 2014   Left breast, 11:00 core biopsy Fibroadenoma  . COLONOSCOPY N/A 05/14/2015   Procedure: COLONOSCOPY;  Surgeon: Hulen Luster, MD;  Location: [facility] ENDOSCOPY;  Service: Gastroenterology;  Laterality: N/A;  . PLACEMENT OF BREAST IMPLANTS  2004   Dr. Cathie Hoops    Prior to Admission medications   Medication Sig Start Date End Date Taking? Authorizing Provider  cyanocobalamin (,VITAMIN B-12,) 1000 MCG/ML injection Inject 1 mL (1,000 mcg total) into the muscle every 30 (thirty) days. Patient not taking: Reported on 09/03/2018 10/22/17   Joylene Igo, CNM  FLUoxetine (PROZAC) 40 MG capsule Take 1 capsule (40 mg total) by mouth daily. 12/13/18   Shambley, Melody N, CNM  phentermine (ADIPEX-P) 37.5 MG tablet Take 1 tablet (37.5 mg total) by mouth daily before breakfast. Patient not taking: Reported on 03/25/2018 03/19/17   Shambley, Melody N, CNM  Vitamin D, Ergocalciferol, (DRISDOL) 1.25 MG (50000 UT) CAPS capsule Take 1 capsule (50,000 Units total) by mouth 2 (two) times a week. 05/17/18   Joylene Igo, CNM    No family history on file.   Social History   Tobacco Use  . Smoking status: Never Smoker  . Smokeless tobacco: Never Used  Substance Use Topics  . Alcohol use: Yes    Comment: occas  . Drug use: No    Allergies as of 07/27/2019  . (No Known Allergies)    Review of Systems:    All systems reviewed and  negative except where noted in HPI.   Physical Exam:  LMP  (LMP Unknown)  No LMP recorded (lmp unknown). Patient has had a hysterectomy. General:   Alert,  Well-developed, well-nourished, pleasant and cooperative in NAD Head:  Normocephalic and atraumatic. Eyes:  Sclera clear, no icterus.   Conjunctiva pink. Ears:  Normal auditory acuity. Neck:  Supple; no masses or thyromegaly. Lungs:  Respirations even and unlabored.  Clear throughout to auscultation.   No wheezes, crackles, or rhonchi. No acute distress. Heart:  Regular rate and rhythm; no murmurs, clicks,  rubs, or gallops. Abdomen:  Normal bowel sounds.  No bruits.  Soft, non-tender and non-distended without masses, hepatosplenomegaly or hernias noted.  No guarding or rebound tenderness.  Negative Carnett sign.   Rectal:  Deferred.  Pulses:  Normal pulses noted. Extremities:  No clubbing or edema.  No cyanosis. Neurologic:  Alert and oriented x3;  grossly normal neurologically. Skin:  Intact without significant lesions or rashes.  No jaundice. Lymph Nodes:  No significant cervical adenopathy. Psych:  Alert and cooperative. Normal mood and affect.  Imaging Studies: No results found.  Assessment and Plan:   Linda Jimenez is a 44 y.o. y/o female who comes in today with a history of possible colitis.  The patient has been diagnosed with ulcerative colitis although there is no sign of any chronicity seen on the biopsies.  It is unlikely that the patient has chronic ulcerative colitis.  The patient also never had a follow-up screening exam to see if the mucosa had healed from the acute inflammatory reaction.  Her abdominal pain swelling with gas is likely due to lactose intolerance.  The patient will be set up for a screening colonoscopy.  The patient has been explained the plan and agrees with it.    [name], MD. [name]    Note: This dictation was prepared with Dragon dictation along with smaller phrase technology. Any transcriptional errors that result from this process are unintentional.

## 2019-07-29 ENCOUNTER — Other Ambulatory Visit: Payer: Self-pay

## 2019-07-29 DIAGNOSIS — Z1211 Encounter for screening for malignant neoplasm of colon: Secondary | ICD-10-CM

## 2019-09-05 ENCOUNTER — Encounter: Payer: Self-pay | Admitting: Gastroenterology

## 2019-09-05 ENCOUNTER — Other Ambulatory Visit: Payer: Self-pay

## 2019-09-07 ENCOUNTER — Other Ambulatory Visit
Admission: RE | Admit: 2019-09-07 | Discharge: 2019-09-07 | Disposition: A | Discharge status: Home / Self Care | Payer: Managed Care, Other (non HMO) | Source: Ambulatory Visit | Attending: Gastroenterology | Admitting: Gastroenterology

## 2019-09-07 DIAGNOSIS — Z01812 Encounter for preprocedural laboratory examination: Secondary | ICD-10-CM | POA: Diagnosis present

## 2019-09-07 DIAGNOSIS — Z20822 Contact with and (suspected) exposure to covid-19: Secondary | ICD-10-CM | POA: Diagnosis not present

## 2019-09-07 LAB — SARS CORONAVIRUS 2 (TAT 6-24 HRS): SARS Coronavirus 2: NEGATIVE

## 2019-09-08 NOTE — Discharge Instructions (Signed)
General Anesthesia, Adult, Care After This sheet gives you information about how to care for yourself after your procedure. Your health care provider may also give you more specific instructions. If you have problems or questions, contact your health care provider. What can I expect after the procedure? After the procedure, the following side effects are common:  Pain or discomfort at the IV site.  Nausea.  Vomiting.  Sore throat.  Trouble concentrating.  Feeling cold or chills.  Weak or tired.  Sleepiness and fatigue.  Soreness and body aches. These side effects can affect parts of the body that were not involved in surgery. Follow these instructions at home:  For at least 24 hours after the procedure:  Have a responsible adult stay with you. It is important to have someone help care for you until you are awake and alert.  Rest as needed.  Do not: ? Participate in activities in which you could fall or become injured. ? Drive. ? Use heavy machinery. ? Drink alcohol. ? Take sleeping pills or medicines that cause drowsiness. ? Make important decisions or sign legal documents. ? Take care of children on your own. Eating and drinking  Follow any instructions from your health care provider about eating or drinking restrictions.  When you feel hungry, start by eating small amounts of foods that are soft and easy to digest (bland), such as toast. Gradually return to your regular diet.  Drink enough fluid to keep your urine pale yellow.  If you vomit, rehydrate by drinking water, juice, or clear broth. General instructions  If you have sleep apnea, surgery and certain medicines can increase your risk for breathing problems. Follow instructions from your health care provider about wearing your sleep device: ? Anytime you are sleeping, including during daytime naps. ? While taking prescription pain medicines, sleeping medicines, or medicines that make you drowsy.  Return to  your normal activities as told by your health care provider. Ask your health care provider what activities are safe for you.  Take over-the-counter and prescription medicines only as told by your health care provider.  If you smoke, do not smoke without supervision.  Keep all follow-up visits as told by your health care provider. This is important. Contact a health care provider if:  You have nausea or vomiting that does not get better with medicine.  You cannot eat or drink without vomiting.  You have pain that does not get better with medicine.  You are unable to pass urine.  You develop a skin rash.  You have a fever.  You have redness around your IV site that gets worse. Get help right away if:  You have difficulty breathing.  You have chest pain.  You have blood in your urine or stool, or you vomit blood. Summary  After the procedure, it is common to have a sore throat or nausea. It is also common to feel tired.  Have a responsible adult stay with you for the first 24 hours after general anesthesia. It is important to have someone help care for you until you are awake and alert.  When you feel hungry, start by eating small amounts of foods that are soft and easy to digest (bland), such as toast. Gradually return to your regular diet.  Drink enough fluid to keep your urine pale yellow.  Return to your normal activities as told by your health care provider. Ask your health care provider what activities are safe for you. This information is not   intended to replace advice given to you by your health care provider. Make sure you discuss any questions you have with your health care provider. Document Revised: 06/19/2017 Document Reviewed: 01/30/2017 Elsevier Patient Education  2020 Elsevier Inc.  

## 2019-09-09 ENCOUNTER — Other Ambulatory Visit: Payer: Self-pay

## 2019-09-09 ENCOUNTER — Encounter: Payer: Self-pay | Admitting: Gastroenterology

## 2019-09-09 ENCOUNTER — Ambulatory Visit: Payer: Managed Care, Other (non HMO) | Admitting: Anesthesiology

## 2019-09-09 ENCOUNTER — Ambulatory Visit
Admission: RE | Admit: 2019-09-09 | Discharge: 2019-09-09 | Disposition: A | Discharge status: Home / Self Care | Payer: Managed Care, Other (non HMO) | Attending: Gastroenterology | Admitting: Gastroenterology

## 2019-09-09 ENCOUNTER — Encounter
Admission: RE | Disposition: A | Discharge status: Home / Self Care | Payer: Self-pay | Source: Home / Self Care | Attending: Gastroenterology

## 2019-09-09 DIAGNOSIS — Z6834 Body mass index (BMI) 34.0-34.9, adult: Secondary | ICD-10-CM | POA: Diagnosis not present

## 2019-09-09 DIAGNOSIS — K6389 Other specified diseases of intestine: Secondary | ICD-10-CM | POA: Diagnosis not present

## 2019-09-09 DIAGNOSIS — K523 Indeterminate colitis: Secondary | ICD-10-CM

## 2019-09-09 DIAGNOSIS — K529 Noninfective gastroenteritis and colitis, unspecified: Secondary | ICD-10-CM | POA: Insufficient documentation

## 2019-09-09 DIAGNOSIS — E669 Obesity, unspecified: Secondary | ICD-10-CM | POA: Insufficient documentation

## 2019-09-09 HISTORY — DX: Encounter for fitting and adjustment of orthodontic device: Z46.4

## 2019-09-09 HISTORY — PX: COLONOSCOPY WITH PROPOFOL: SHX5780

## 2019-09-09 HISTORY — DX: Reserved for inherently not codable concepts without codable children: IMO0001

## 2019-09-09 SURGERY — COLONOSCOPY WITH PROPOFOL
Anesthesia: General | Site: Rectum

## 2019-09-09 MED ORDER — LIDOCAINE HCL (CARDIAC) PF 100 MG/5ML IV SOSY
PREFILLED_SYRINGE | INTRAVENOUS | Status: DC | PRN
  Administered 2019-09-09: 30 mg via INTRAVENOUS

## 2019-09-09 MED ORDER — PROPOFOL 10 MG/ML IV BOLUS
INTRAVENOUS | Status: DC | PRN
  Administered 2019-09-09: 100 mg via INTRAVENOUS

## 2019-09-09 MED ORDER — LACTATED RINGERS IV SOLN: INTRAVENOUS | Status: DC

## 2019-09-09 MED ORDER — STERILE WATER FOR IRRIGATION IR SOLN
Status: DC | PRN
  Administered 2019-09-09: .05 mL

## 2019-09-09 SURGICAL SUPPLY — 6 items
CANISTER SUCT 1200ML W/VALVE (MISCELLANEOUS) ×3 IMPLANT
FORCEPS BIOP RAD 4 LRG CAP 4 (CUTTING FORCEPS) ×3 IMPLANT
GOWN CVR UNV OPN BCK APRN NK (MISCELLANEOUS) ×2 IMPLANT
GOWN ISOL THUMB LOOP REG UNIV (MISCELLANEOUS) ×4
KIT ENDO PROCEDURE OLY (KITS) ×3 IMPLANT
WATER STERILE IRR 250ML POUR (IV SOLUTION) ×3 IMPLANT

## 2019-09-09 NOTE — Anesthesia Procedure Notes (Signed)
Procedure Name: General with mask airway Performed by: Izetta Dakin, CRNA Pre-anesthesia Checklist: Timeout performed, Patient being monitored, Suction available, Emergency Drugs available and Patient identified Patient Re-evaluated:Patient Re-evaluated prior to induction Oxygen Delivery Method: Nasal cannula

## 2019-09-09 NOTE — H&P (Signed)
[name], MD [address], [place] [address] Phone:[phone] Fax : [phone]  Primary Care Physician:  Idelle Crouch, MD Primary Gastroenterologist:  Dr. Allen Norris  Pre-Procedure History & Physical: HPI:  Linda Jimenez is a 44 y.o. female is here for an colonoscopy.   Past Medical History:  Diagnosis Date  . Endometriosis   . Obesity   . Orthodontics    permanent bottom retainer  . Vitamin D deficiency disease     Past Surgical History:  Procedure Laterality Date  . ABDOMINAL HYSTERECTOMY    . BREAST BIOPSY Left September 2014   Left breast, 11:00 core biopsy Fibroadenoma  . COLONOSCOPY N/A 05/14/2015   Procedure: COLONOSCOPY;  Surgeon: Hulen Luster, MD;  Location: [facility];  Service: Gastroenterology;  Laterality: N/A;  . PLACEMENT OF BREAST IMPLANTS  2004   Dr. Cathie Hoops    Prior to Admission medications   Medication Sig Start Date End Date Taking? Authorizing Provider  FLUoxetine (PROZAC) 40 MG capsule Take 1 capsule (40 mg total) by mouth daily. 12/13/18  Yes Shambley, Melody N, CNM  UNABLE TO FIND TruVY weight Loss supplement   Yes [provider]    Allergies as of 07/29/2019  . (No Known Allergies)    History reviewed. No pertinent family history.  Social History   Socioeconomic History  . Marital status: Married    Spouse name: Not on file  . Number of children: Not on file  . Years of education: Not on file  . Highest education level: Not on file  Occupational History  . Not on file  Tobacco Use  . Smoking status: Never Smoker  . Smokeless tobacco: Never Used  Substance and Sexual Activity  . Alcohol use: Yes    Comment: occas  . Drug use: No  . Sexual activity: Yes    Birth control/protection: Surgical  Other Topics Concern  . Not on file  Social History Narrative  . Not on file   Social Determinants of Health   Financial Resource Strain:   . Difficulty of Paying Living Expenses:   Food  Insecurity:   . Worried About Charity fundraiser in the Last Year:   . Arboriculturist in the Last Year:   Transportation Needs:   . Film/video editor (Medical):   Marland Kitchen Lack of Transportation (Non-Medical):   Physical Activity:   . Days of Exercise per Week:   . Minutes of Exercise per Session:   Stress:   . Feeling of Stress :   Social Connections:   . Frequency of Communication with Friends and Family:   . Frequency of Social Gatherings with Friends and Family:   . Attends Religious Services:   . Active Member of Clubs or Organizations:   . Attends Archivist Meetings:   Marland Kitchen Marital Status:   Intimate Partner Violence:   . Fear of Current or Ex-Partner:   . Emotionally Abused:   Marland Kitchen Physically Abused:   . Sexually Abused:     Review of Systems: See HPI, otherwise negative ROS  Physical Exam: BP (!) 142/81   Pulse 74   Temp (!) 97.5 F (36.4 C) (Temporal)   Ht 5\' 8"  (1.727 m)   Wt 102.5 kg   LMP  (LMP Unknown)   SpO2 98%   BMI 34.36 kg/m  General:   Alert,  pleasant and cooperative in NAD Head:  Normocephalic and atraumatic. Neck:  Supple; no masses or thyromegaly.  Lungs:  Clear throughout to auscultation.    Heart:  Regular rate and rhythm. Abdomen:  Soft, nontender and nondistended. Normal bowel sounds, without guarding, and without rebound.   Neurologic:  Alert and  oriented x4;  grossly normal neurologically.  Impression/Plan: TYNASIA MILLING is here for an colonoscopy to be performed for history of colitis  Risks, benefits, limitations, and alternatives regarding  colonoscopy have been reviewed with the patient.  Questions have been answered.  All parties agreeable.   [name], MD  09/09/2019, 8:20 AM

## 2019-09-09 NOTE — Anesthesia Preprocedure Evaluation (Signed)
Anesthesia Evaluation  Patient identified by MRN, date of birth, ID band Patient awake    Reviewed: Allergy & Precautions, NPO status , Patient's Chart, lab work & pertinent test results  Airway Mallampati: II  TM Distance: >3 FB     Dental   Pulmonary neg pulmonary ROS,    breath sounds clear to auscultation       Cardiovascular negative cardio ROS   Rhythm:Regular Rate:Normal     Neuro/Psych Depression    GI/Hepatic   Endo/Other  Obesity - BMI 34 Endometriosis  Renal/GU      Musculoskeletal   Abdominal   Peds  Hematology   Anesthesia Other Findings   Reproductive/Obstetrics                             Anesthesia Physical Anesthesia Plan  ASA: II  Anesthesia Plan: General   Post-op Pain Management:    Induction: Intravenous  PONV Risk Score and Plan: Propofol infusion, TIVA and Treatment may vary due to age or medical condition  Airway Management Planned: Natural Airway and Nasal Cannula  Additional Equipment:   Intra-op Plan:   Post-operative Plan:   Informed Consent: I have reviewed the patients History and Physical, chart, labs and discussed the procedure including the risks, benefits and alternatives for the proposed anesthesia with the patient or authorized representative who has indicated his/her understanding and acceptance.       Plan Discussed with: CRNA  Anesthesia Plan Comments:         Anesthesia Quick Evaluation

## 2019-09-09 NOTE — Op Note (Signed)
[facility] Patient Name: Linda Jimenez Procedure Date: 09/09/2019 8:38 AM MRN: [id number] Account #: [id number] Date of Birth: 1976-02-24 Admit Type: Outpatient Age: 44 Room: [facility] OR ROOM 01 Gender: Female Note Status: Finalized Procedure:             Colonoscopy Indications:           Determine extent and severity of inflammatory bowel                         disease Providers:             [name] MD, MD Referring MD:          Leonie Douglas. Doy Hutching, MD (Referring MD) Medicines:             Propofol per Anesthesia Complications:         No immediate complications. Procedure:             Pre-Anesthesia Assessment:                        - Prior to the procedure, a History and Physical was                         performed, and patient medications and allergies were                         reviewed. The patient's tolerance of previous                         anesthesia was also reviewed. The risks and benefits                         of the procedure and the sedation options and risks                         were discussed with the patient. All questions were                         answered, and informed consent was obtained. Prior                         Anticoagulants: The patient has taken no previous                         anticoagulant or antiplatelet agents. ASA Grade                         Assessment: II - A patient with mild systemic disease.                         After reviewing the risks and benefits, the patient                         was deemed in satisfactory condition to undergo the                         procedure.  After obtaining informed consent, the colonoscope was                         passed under direct vision. Throughout the procedure,                         the patient's blood pressure, pulse, and oxygen                         saturations were monitored continuously. The was          introduced through the anus and advanced to the the                         terminal ileum. The colonoscopy was performed without                         difficulty. The patient tolerated the procedure well.                         The quality of the bowel preparation was excellent.                         The colonoscopy was performed without difficulty. Findings:      The perianal and digital rectal examinations were normal.      The terminal ileum appeared normal.      The colon (entire examined portion) appeared normal. Multiple biopsies       were obtained in the entire colon with cold forceps for histology. Impression:            - The examined portion of the ileum was normal.                        - The entire examined colon is normal.                        - Multiple biopsies were obtained in the entire colon. Recommendation:        - Discharge patient to home.                        - Resume previous diet.                        - Continue present medications.                        - Await pathology results. 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 All rights reserved. The codes documented in this report are preliminary and upon coder review may  be revised to meet current  compliance requirements. [name] MD, MD 09/09/2019 9:03:45 AM This report has been signed electronically. Number of Addenda: 0 Note Initiated On: 09/09/2019 8:38 AM Scope Withdrawal Time: 0 hours 6 minutes 26 seconds  Total Procedure Duration: 0 hours 10 minutes 24 seconds  Estimated Blood Loss:  Estimated blood loss: none.      [facility]

## 2019-09-09 NOTE — Anesthesia Postprocedure Evaluation (Signed)
Anesthesia Post Note  Patient: Linda Jimenez  Procedure(s) Performed: COLONOSCOPY WITH PROPOFOL (N/A Rectum)     Patient location during evaluation: PACU Anesthesia Type: General Level of consciousness: awake Pain management: pain level controlled Vital Signs Assessment: post-procedure vital signs reviewed and stable Respiratory status: respiratory function stable Cardiovascular status: stable Postop Assessment: no signs of nausea or vomiting Anesthetic complications: no    Veda Canning

## 2019-09-09 NOTE — Transfer of Care (Signed)
Immediate Anesthesia Transfer of Care Note  Patient: Linda Jimenez  Procedure(s) Performed: COLONOSCOPY WITH PROPOFOL (N/A Rectum)  Patient Location: PACU  Anesthesia Type: General  Level of Consciousness: awake, alert  and patient cooperative  Airway and Oxygen Therapy: Patient Spontanous Breathing and Patient connected to supplemental oxygen  Post-op Assessment: Post-op Vital signs reviewed, Patient's Cardiovascular Status Stable, Respiratory Function Stable, Patent Airway and No signs of Nausea or vomiting  Post-op Vital Signs: Reviewed and stable  Complications: No apparent anesthesia complications

## 2019-09-12 ENCOUNTER — Encounter: Payer: Self-pay | Admitting: *Deleted

## 2019-09-12 LAB — SURGICAL PATHOLOGY

## 2019-09-13 ENCOUNTER — Telehealth: Payer: Self-pay

## 2019-09-13 NOTE — Telephone Encounter (Signed)
-----   Message from [name], MD sent at 09/13/2019  2:03 PM EDT ----- Let the patient know that all the biopsies of her right colon and her left colon and rectum did not show any sign of chronic inflammation or acute inflammation.  There is no sign of any inflammatory bowel disease or ulcerative colitis.  No further investigation for ulcerative colitis needs to be done.  Your next colonoscopy should be in 10 years.

## 2019-09-13 NOTE — Telephone Encounter (Signed)
Pt notified of results via mychart.  

## 2019-09-15 ENCOUNTER — Ambulatory Visit: Payer: Managed Care, Other (non HMO) | Attending: Internal Medicine

## 2019-09-15 DIAGNOSIS — Z23 Encounter for immunization: Secondary | ICD-10-CM

## 2019-09-15 NOTE — Progress Notes (Signed)
   Covid-19 Vaccination Clinic  Name:  Linda Jimenez    MRN: [id number] DOB: 1976-01-10  09/15/2019  [name] was observed post Covid-19 immunization for 15 minutes without incident. She was provided with Vaccine Information Sheet and instruction to access the V-Safe system.   [name] was instructed to call 911 with any severe reactions post vaccine: Marland Kitchen Difficulty breathing  . Swelling of face and throat  . A fast heartbeat  . A bad rash all over body  . Dizziness and weakness   Immunizations Administered    Name Date Dose VIS Date Route   Pfizer COVID-19 Vaccine 09/15/2019  4:42 PM 0.3 mL 06/10/2019 Intramuscular   Manufacturer: [place]   Lot: [id number]   [place]: [id number]

## 2019-10-11 ENCOUNTER — Ambulatory Visit: Payer: Managed Care, Other (non HMO) | Attending: Internal Medicine

## 2019-10-11 DIAGNOSIS — Z23 Encounter for immunization: Secondary | ICD-10-CM

## 2019-10-11 NOTE — Progress Notes (Signed)
   Covid-19 Vaccination Clinic  Name:  DZIYAH ECKHOLM    MRN: [id number] DOB: 1975/11/01  10/11/2019  Ms. Kroh was observed post Covid-19 immunization for 15 minutes without incident. She was provided with Vaccine Information Sheet and instruction to access the V-Safe system.   Ms. Lerer was instructed to call 911 with any severe reactions post vaccine: Marland Kitchen Difficulty breathing  . Swelling of face and throat  . A fast heartbeat  . A bad rash all over body  . Dizziness and weakness   Immunizations Administered    Name Date Dose VIS Date Route   Pfizer COVID-19 Vaccine 10/11/2019  8:31 AM 0.3 mL 06/10/2019 Intramuscular   Manufacturer: [place]   Lot: [id number]   [place]: [id number]

## 2020-01-19 ENCOUNTER — Ambulatory Visit
Admission: RE | Admit: 2020-01-19 | Discharge: 2020-01-19 | Disposition: A | Discharge status: Home / Self Care | Payer: Managed Care, Other (non HMO) | Source: Ambulatory Visit | Attending: Internal Medicine | Admitting: Internal Medicine

## 2020-01-19 DIAGNOSIS — Z1231 Encounter for screening mammogram for malignant neoplasm of breast: Secondary | ICD-10-CM | POA: Insufficient documentation

## 2020-04-09 ENCOUNTER — Other Ambulatory Visit: Payer: Self-pay

## 2020-04-09 ENCOUNTER — Encounter: Payer: Self-pay | Admitting: Emergency Medicine

## 2020-04-09 ENCOUNTER — Ambulatory Visit
Admission: EM | Admit: 2020-04-09 | Discharge: 2020-04-09 | Disposition: A | Discharge status: Home / Self Care | Payer: 59 | Attending: Physician Assistant | Admitting: Physician Assistant

## 2020-04-09 DIAGNOSIS — Z20822 Contact with and (suspected) exposure to covid-19: Secondary | ICD-10-CM | POA: Diagnosis not present

## 2020-04-09 DIAGNOSIS — Z79899 Other long term (current) drug therapy: Secondary | ICD-10-CM | POA: Insufficient documentation

## 2020-04-09 DIAGNOSIS — E559 Vitamin D deficiency, unspecified: Secondary | ICD-10-CM | POA: Insufficient documentation

## 2020-04-09 DIAGNOSIS — J069 Acute upper respiratory infection, unspecified: Secondary | ICD-10-CM | POA: Insufficient documentation

## 2020-04-09 DIAGNOSIS — R059 Cough, unspecified: Secondary | ICD-10-CM | POA: Diagnosis not present

## 2020-04-09 DIAGNOSIS — E669 Obesity, unspecified: Secondary | ICD-10-CM | POA: Diagnosis not present

## 2020-04-09 DIAGNOSIS — F3341 Major depressive disorder, recurrent, in partial remission: Secondary | ICD-10-CM | POA: Insufficient documentation

## 2020-04-09 DIAGNOSIS — R0981 Nasal congestion: Secondary | ICD-10-CM | POA: Insufficient documentation

## 2020-04-09 DIAGNOSIS — Z6834 Body mass index (BMI) 34.0-34.9, adult: Secondary | ICD-10-CM | POA: Insufficient documentation

## 2020-04-09 DIAGNOSIS — R519 Headache, unspecified: Secondary | ICD-10-CM | POA: Diagnosis not present

## 2020-04-09 NOTE — ED Triage Notes (Signed)
Pt c/o nasal congestion, runny nose, cough, sinus pain/pressure and headache. Started about 3 days ago. She has had covid vaccines.

## 2020-04-09 NOTE — Discharge Instructions (Signed)

## 2020-04-09 NOTE — ED Provider Notes (Signed)
[facility]    CSN: [id number] Arrival date & time: 04/09/20  1833      History   Chief Complaint Chief Complaint  Patient presents with  . Cough  . Nasal Congestion    HPI CONTINA STRAIN is a 44 y.o. female presenting for 2-day history of nasal congestion, runny nose, cough, sinus pain and pressure, headache and postnasal drainage.  Denies any known Covid exposure flu vaccine for Covid.  Has been taking DayQuil without relief.  Patient denies any chest pain or breathing difficulty.  Past medical history not significant for any cardiopulmonary disease or recurrent sinus infections.  No other complaints or concerns.  HPI  Past Medical History:  Diagnosis Date  . Endometriosis   . Obesity   . Orthodontics    permanent bottom retainer  . Vitamin D deficiency disease     Patient Active Problem List   Diagnosis Date Noted  . Indeterminate colitis   . Recurrent major depressive disorder, in partial remission ([place]) 06/06/2019  . Ulcerative pancolitis without complication ([place]) 74/94/4967  . Carotid bruit 03/25/2018  . Vitamin D deficiency 12/09/2016  . 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 ABDOMINAL HYSTERECTOMY    . AUGMENTATION MAMMAPLASTY Bilateral 2004  . BREAST BIOPSY Left September 2014   Left breast, 11:00 core biopsy Fibroadenoma  . COLONOSCOPY N/A 05/14/2015   Procedure: COLONOSCOPY;  Surgeon: Hulen Luster, MD;  Location: [facility] ENDOSCOPY;  Service: Gastroenterology;  Laterality: N/A;  . COLONOSCOPY WITH PROPOFOL N/A 09/09/2019   Procedure: COLONOSCOPY WITH PROPOFOL;  Surgeon: [name], MD;  Location: [facility];  Service: Endoscopy;  Laterality: N/A;  . PLACEMENT OF BREAST IMPLANTS  2004   Dr. Cathie Hoops    OB History    Gravida  2   Para  2   Term      Preterm      AB      Living  2     SAB      TAB      Ectopic      Multiple      Live Births           Obstetric  Comments  1st Menstrual Cycle:  10 1st Pregnancy:  21         Home Medications    Prior to Admission medications   Medication Sig Start Date End Date Taking? Authorizing Provider  ergocalciferol (VITAMIN D2) 1.25 MG (50000 UT) capsule Take by mouth. 04/04/20 05/04/20 Yes [provider]  FLUoxetine (PROZAC) 40 MG capsule Take 1 capsule (40 mg total) by mouth daily. 12/13/18  Yes Shambley, Melody N, CNM  UNABLE TO FIND TruVY weight Loss supplement    [provider]    Family History Family History  Problem Relation Age of Onset  . Breast cancer Neg Hx     Social History Social History   Tobacco Use  . Smoking status: Never Smoker  . Smokeless tobacco: Never Used  Vaping Use  . Vaping Use: Never used  Substance Use Topics  . Alcohol use: Yes    Comment: occas  . Drug use: No     Allergies   Patient has no known allergies.   Review of Systems Review of Systems  Constitutional: Negative for chills, diaphoresis, fatigue and fever.  HENT: Positive for congestion, postnasal drip, rhinorrhea, sinus pressure and sinus pain. Negative for ear pain and sore throat.  Respiratory: Positive for cough. Negative for shortness of breath.   Gastrointestinal: Negative for abdominal pain, nausea and vomiting.  Musculoskeletal: Negative for arthralgias and myalgias.  Skin: Negative for rash.  Neurological: Positive for headaches. Negative for weakness.  Hematological: Negative for adenopathy.     Physical Exam Triage Vital Signs ED Triage Vitals  Enc Vitals Group     BP 04/09/20 1946 140/89     Pulse Rate 04/09/20 1946 80     Resp 04/09/20 1946 18     Temp 04/09/20 1946 97.8 F (36.6 C)     Temp Source 04/09/20 1946 Oral     SpO2 04/09/20 1946 100 %     Weight 04/09/20 1945 225 lb 15.5 oz (102.5 kg)     Height 04/09/20 1945 5\' 8"  (1.727 m)     Head Circumference --      Peak Flow --      Pain Score 04/09/20 1945 0     Pain Loc --      Pain Edu? --        Excl. in [place]? --    No data found.  Updated Vital Signs BP 140/89 (BP Location: Right Arm)   Pulse 80   Temp 97.8 F (36.6 C) (Oral)   Resp 18   Ht 5\' 8"  (1.727 m)   Wt 225 lb 15.5 oz (102.5 kg)   LMP  (LMP Unknown)   SpO2 100%   BMI 34.36 kg/m       Physical Exam Vitals and nursing note reviewed.  Constitutional:      General: She is not in acute distress.    Appearance: Normal appearance. She is not ill-appearing or toxic-appearing.  HENT:     Head: Normocephalic and atraumatic.     Nose: Congestion present.     Mouth/Throat:     Mouth: Mucous membranes are moist.     Pharynx: Oropharynx is clear. Posterior oropharyngeal erythema (mild posterior pharyngeal erythema) present.  Eyes:     General: No scleral icterus.       Right eye: No discharge.        Left eye: No discharge.     Conjunctiva/sclera: Conjunctivae normal.  Cardiovascular:     Rate and Rhythm: Normal rate and regular rhythm.     Heart sounds: Normal heart sounds.  Pulmonary:     Effort: Pulmonary effort is normal. No respiratory distress.     Breath sounds: Normal breath sounds.  Musculoskeletal:     Cervical back: Neck supple.  Lymphadenopathy:     Cervical: No cervical adenopathy.  Skin:    General: Skin is dry.  Neurological:     General: No focal deficit present.     Mental Status: She is alert. Mental status is at baseline.     Motor: No weakness.     Gait: Gait normal.  Psychiatric:        Mood and Affect: Mood normal.        Behavior: Behavior normal.        Thought Content: Thought content normal.      UC Treatments / Results  Labs (all labs ordered are listed, but only abnormal results are displayed) Labs Reviewed  SARS CORONAVIRUS 2 (TAT 6-24 HRS)    EKG   Radiology No results found.  Procedures Procedures (including critical care time)  Medications Ordered in UC Medications - No data to display  Initial Impression / Assessment and Plan / UC Course  I have  reviewed  the triage vital signs and the nursing notes.  Pertinent labs & imaging results that were available during my care of the patient were reviewed by me and considered in my medical decision making (see chart for details).   Suspect viral infection.  Covid testing obtained.  CDC guidelines, isolation protocol and ED precautions discussed if positive.  Advised symptomatic care with over-the-counter cough/cold medication.  Advised to follow-up as needed for any new or worsening symptoms or if not better in the next 2 weeks.  Final Clinical Impressions(s) / UC Diagnoses   Final diagnoses:  Viral upper respiratory tract infection  Nasal congestion  Cough     Discharge Instructions     URI/COLD SYMPTOMS: Your exam today is consistent with a viral illness. Antibiotics are not indicated at this time. Use medications as directed, including cough syrup, nasal saline, and decongestants. Your symptoms should improve over the next few days and resolve within 7-10 days. Increase rest and fluids. F/u if symptoms worsen or predominate such as sore throat, ear pain, productive cough, shortness of breath, or if you develop high fevers or worsening fatigue over the next several days.    You have received COVID testing today either for positive exposure, concerning symptoms that could be related to COVID infection, screening purposes, or re-testing after confirmed positive.  Your test obtained today checks for active viral infection in the last 1-2 weeks. If your test is negative now, you can still test positive later. So, if you do develop symptoms you should either get re-tested and/or isolate x 10 days. Please follow CDC guidelines.  While Rapid antigen tests come back in 15-20 minutes, send out PCR/molecular test results typically come back within 24 hours. In the mean time, if you are symptomatic, assume this could be a positive test and treat/monitor yourself as if you do have COVID.   We will call  with test results. Please download the MyChart app and set up a profile to access test results.   If symptomatic, go home and rest. Push fluids. Take Tylenol as needed for discomfort. Gargle warm salt water. Throat lozenges. Take Mucinex DM or Robitussin for cough. Humidifier in bedroom to ease coughing. Warm showers. Also review the COVID handout for more information.  COVID-19 INFECTION: The incubation period of COVID-19 is approximately 14 days after exposure, with most symptoms developing in roughly 4-5 days. Symptoms may range in severity from mild to critically severe. Roughly 80% of those infected will have mild symptoms. People of any age may become infected with COVID-19 and have the ability to transmit the virus. The most common symptoms include: fever, fatigue, cough, body aches, headaches, sore throat, nasal congestion, shortness of breath, nausea, vomiting, diarrhea, changes in smell and/or taste.    COURSE OF ILLNESS Some patients may begin with mild disease which can progress quickly into critical symptoms. If your symptoms are worsening please call ahead to the Emergency Department and proceed there for further treatment. Recovery time appears to be roughly 1-2 weeks for mild symptoms and 3-6 weeks for severe disease.   GO IMMEDIATELY TO ER FOR FEVER YOU ARE UNABLE TO GET DOWN WITH TYLENOL, BREATHING PROBLEMS, CHEST PAIN, FATIGUE, LETHARGY, INABILITY TO EAT OR DRINK, ETC  QUARANTINE AND ISOLATION: To help decrease the spread of COVID-19 please remain isolated if you have COVID infection or are highly suspected to have COVID infection. This means -stay home and isolate to one room in the home if you live with others. Do not share  a bed or bathroom with others while ill, sanitize and wipe down all countertops and keep common areas clean and disinfected. You may discontinue isolation if you have a mild case and are asymptomatic 10 days after symptom onset as long as you have been fever free  >24 hours without having to take Motrin or Tylenol. If your case is more severe (meaning you develop pneumonia or are admitted in the hospital), you may have to isolate longer.   If you have been in close contact (within 6 feet) of someone diagnosed with COVID 19, you are advised to quarantine in your home for 14 days as symptoms can develop anywhere from 2-14 days after exposure to the virus. If you develop symptoms, you  must isolate.  Most current guidelines for COVID after exposure -isolate 10 days if you ARE NOT tested for COVID as long as symptoms do not develop -isolate 7 days if you are tested and remain asymptomatic -You do not necessarily need to be tested for COVID if you have + exposure and        develop   symptoms. Just isolate at home x10 days from symptom onset During this global pandemic, CDC advises to practice social distancing, try to stay at least 66ft away from others at all times. Wear a face covering. Wash and sanitize your hands regularly and avoid going anywhere that is not necessary.  KEEP IN MIND THAT THE COVID TEST IS NOT 100% ACCURATE AND YOU SHOULD STILL DO EVERYTHING TO PREVENT POTENTIAL SPREAD OF VIRUS TO OTHERS (WEAR MASK, WEAR GLOVES, [place] HANDS AND SANITIZE REGULARLY). IF INITIAL TEST IS NEGATIVE, THIS MAY NOT MEAN YOU ARE DEFINITELY NEGATIVE. MOST ACCURATE TESTING IS DONE 5-7 DAYS AFTER EXPOSURE.   It is not advised by CDC to get re-tested after receiving a positive COVID test since you can still test positive for weeks to months after you have already cleared the virus.   *If you have not been vaccinated for COVID, I strongly suggest you consider getting vaccinated as long as there are no contraindications.     ED Prescriptions    None     PDMP not reviewed this encounter.   Danton Clap, PA-C 04/10/20 1818

## 2020-04-10 LAB — SARS CORONAVIRUS 2 (TAT 6-24 HRS): SARS Coronavirus 2: NEGATIVE

## 2020-09-12 ENCOUNTER — Other Ambulatory Visit (HOSPITAL_COMMUNITY): Payer: Self-pay | Admitting: Podiatry

## 2020-09-12 ENCOUNTER — Other Ambulatory Visit: Payer: Self-pay | Admitting: Podiatry

## 2020-09-12 DIAGNOSIS — M216X2 Other acquired deformities of left foot: Secondary | ICD-10-CM

## 2020-09-12 DIAGNOSIS — M21862 Other specified acquired deformities of left lower leg: Secondary | ICD-10-CM

## 2020-09-12 DIAGNOSIS — M76822 Posterior tibial tendinitis, left leg: Secondary | ICD-10-CM

## 2020-09-24 ENCOUNTER — Other Ambulatory Visit: Payer: Self-pay

## 2020-09-24 ENCOUNTER — Ambulatory Visit
Admission: RE | Admit: 2020-09-24 | Discharge: 2020-09-24 | Disposition: A | Discharge status: Home / Self Care | Payer: 59 | Source: Ambulatory Visit | Attending: Podiatry | Admitting: Podiatry

## 2020-09-24 DIAGNOSIS — M21862 Other specified acquired deformities of left lower leg: Secondary | ICD-10-CM

## 2020-09-24 DIAGNOSIS — M76822 Posterior tibial tendinitis, left leg: Secondary | ICD-10-CM | POA: Diagnosis present

## 2020-09-24 DIAGNOSIS — M216X2 Other acquired deformities of left foot: Secondary | ICD-10-CM | POA: Insufficient documentation

## 2020-10-01 ENCOUNTER — Other Ambulatory Visit: Payer: Self-pay | Admitting: Podiatry

## 2020-10-11 ENCOUNTER — Other Ambulatory Visit: Payer: Self-pay

## 2020-10-11 ENCOUNTER — Other Ambulatory Visit
Admission: RE | Admit: 2020-10-11 | Discharge: 2020-10-11 | Disposition: A | Discharge status: Home / Self Care | Payer: 59 | Source: Ambulatory Visit | Attending: Podiatry | Admitting: Podiatry

## 2020-10-11 NOTE — Patient Instructions (Signed)
Your procedure is scheduled on: Friday 10/19/20.  Report to THE FIRST FLOOR REGISTRATION DESK IN THE MEDICAL MALL ON THE MORNING OF SURGERY FIRST, THEN YOU WILL CHECK IN AT THE SURGERY INFORMATION DESK LOCATED OUTSIDE THE SAME DAY SURGERY DEPARTMENT LOCATED ON 2ND FLOOR MEDICAL MALL ENTRANCE.  To find out your arrival time please call [phone] between 1PM - 3PM on Thursday 10/18/20.   Remember: Instructions that are not followed completely may result in serious medical risk, up to and including death, or upon the discretion of your surgeon and anesthesiologist your surgery may need to be rescheduled.     __X__ 1. Do not eat food after midnight the night before your procedure.                 No gum chewing or hard candies. You may drink clear liquids up to 2 hours                 before you are scheduled to arrive for your surgery- DO NOT drink clear                 liquids within 2 hours of the start of your surgery.                 Clear Liquids include:  water, apple juice without pulp, clear carbohydrate                 drink such as Clearfast or Gatorade, Black Coffee or Tea (Do not add                 milk or creamer to coffee or tea).  ** Dr. Vickki Muff would like for you to finish your Pre-Surgery Ensure on the morning of surgery 2 hours prior to your scheduled arrival time. **  __X__2.  On the morning of surgery brush your teeth with toothpaste and water, you may rinse your mouth with mouthwash if you wish.  Do not swallow any toothpaste or mouthwash.    __X__ 3.  No Alcohol for 24 hours before or after surgery.  __X__ 4.  Do Not Smoke or use e-cigarettes For 24 Hours Prior to Your Surgery.                 Do not use any chewable tobacco products for at least 6 hours prior to                 surgery.  __X__5.  Notify your doctor if there is any change in your medical condition      (cold, fever, infections).      Do NOT wear jewelry, make-up, hairpins, clips or nail  polish. Do NOT wear lotions, powders, or perfumes.  Do NOT shave 48 hours prior to surgery. Men may shave face and neck. Do NOT bring valuables to the hospital.     [facility] is not responsible for any belongings or valuables.   Contacts, dentures/partials or body piercings may not be worn into surgery. Bring a case for your contacts, glasses or hearing aids, a denture cup will be supplied.    Patients discharged the day of surgery will not be allowed to drive home.     __X__ Take these medicines the morning of surgery with A SIP OF WATER:     1. cetirizine (ZYRTEC)  2. FLUoxetine (PROZAC)     __X__ Use CHG Soap as directed.  __X__ Stop Anti-inflammatories 7 days before surgery such as Advil,  Ibuprofen, Motrin, BC or Goodies Powder, Naprosyn, Naproxen, Aleve, Aspirin, Meloxicam. May take Tylenol if needed for pain or discomfort.   __X__Do not start taking any new herbal supplements or vitamins prior to your procedure.     Wear comfortable clothing (specific to your surgery type) to the hospital.  Plan for stool softeners for home use; pain medications have a tendency to cause constipation. You can also help prevent constipation by eating foods high in fiber such as fruits and vegetables and drinking plenty of fluids as your diet allows.  After surgery, you can prevent lung complications by doing breathing exercises.Take deep breaths and cough every 1-2 hours. Your doctor may order a device called an Incentive Spirometer to help you take deep breaths.  Please call the [place] Department at [phone] if you have any questions about these instructions.

## 2020-10-17 ENCOUNTER — Other Ambulatory Visit: Payer: 59

## 2020-10-19 ENCOUNTER — Ambulatory Visit: Payer: 59 | Admitting: Anesthesiology

## 2020-10-19 ENCOUNTER — Ambulatory Visit: Payer: 59

## 2020-10-19 ENCOUNTER — Ambulatory Visit
Admission: RE | Admit: 2020-10-19 | Discharge: 2020-10-19 | Disposition: A | Discharge status: Home / Self Care | Payer: 59 | Attending: Podiatry | Admitting: Podiatry

## 2020-10-19 ENCOUNTER — Encounter
Admission: RE | Disposition: A | Discharge status: Home / Self Care | Payer: Self-pay | Source: Home / Self Care | Attending: Podiatry

## 2020-10-19 ENCOUNTER — Encounter: Payer: Self-pay | Admitting: Podiatry

## 2020-10-19 DIAGNOSIS — M76822 Posterior tibial tendinitis, left leg: Secondary | ICD-10-CM | POA: Insufficient documentation

## 2020-10-19 DIAGNOSIS — Z79899 Other long term (current) drug therapy: Secondary | ICD-10-CM | POA: Diagnosis not present

## 2020-10-19 DIAGNOSIS — M2012 Hallux valgus (acquired), left foot: Secondary | ICD-10-CM | POA: Diagnosis present

## 2020-10-19 DIAGNOSIS — M216X2 Other acquired deformities of left foot: Secondary | ICD-10-CM | POA: Insufficient documentation

## 2020-10-19 HISTORY — PX: FLAT FOOT RECONSTRUCTION-TAL GASTROC RECESSION: SHX6620

## 2020-10-19 HISTORY — PX: HALLUX VALGUS LAPIDUS: SHX6626

## 2020-10-19 HISTORY — PX: FLAT FOOT CORRECTION: SHX6619

## 2020-10-19 HISTORY — PX: TENDON TRANSFER: SHX6109

## 2020-10-19 SURGERY — FLAT FOOT RECONSTRUCTION-TAL GASTROC RECESSION
Anesthesia: General | Laterality: Left

## 2020-10-19 MED ORDER — CHLORHEXIDINE GLUCONATE 0.12 % MT SOLN: 15.0000 mL | Freq: Once | OROMUCOSAL | Status: AC

## 2020-10-19 MED ORDER — FENTANYL CITRATE (PF) 100 MCG/2ML IJ SOLN
25.0000 ug | INTRAMUSCULAR | Status: DC | PRN
  Administered 2020-10-19 (×2): 50 ug via INTRAVENOUS

## 2020-10-19 MED ORDER — LIDOCAINE HCL (PF) 2 % IJ SOLN
INTRAMUSCULAR | Status: AC
  Filled 2020-10-19: qty 5

## 2020-10-19 MED ORDER — OXYCODONE HCL 5 MG/5ML PO SOLN
5.0000 mg | Freq: Once | ORAL | Status: AC | PRN
Start: 2020-10-19 — End: 2020-10-19

## 2020-10-19 MED ORDER — LIDOCAINE-EPINEPHRINE 1 %-1:100000 IJ SOLN
INTRAMUSCULAR | Status: AC
  Filled 2020-10-19: qty 1

## 2020-10-19 MED ORDER — PROPOFOL 10 MG/ML IV BOLUS
INTRAVENOUS | Status: DC | PRN
  Administered 2020-10-19: 200 mg via INTRAVENOUS

## 2020-10-19 MED ORDER — OXYCODONE-ACETAMINOPHEN 5-325 MG PO TABS: 1.0000 | ORAL_TABLET | Freq: Four times a day (QID) | ORAL | 0 refills | Status: AC | PRN

## 2020-10-19 MED ORDER — LIDOCAINE HCL (PF) 1 % IJ SOLN
INTRAMUSCULAR | Status: AC
  Filled 2020-10-19: qty 30

## 2020-10-19 MED ORDER — MEPERIDINE HCL 50 MG/ML IJ SOLN: 6.2500 mg | INTRAMUSCULAR | Status: DC | PRN

## 2020-10-19 MED ORDER — FENTANYL CITRATE (PF) 100 MCG/2ML IJ SOLN
INTRAMUSCULAR | Status: AC
  Filled 2020-10-19: qty 2

## 2020-10-19 MED ORDER — FENTANYL CITRATE (PF) 100 MCG/2ML IJ SOLN
INTRAMUSCULAR | Status: AC
  Administered 2020-10-19: 50 ug via INTRAVENOUS
  Filled 2020-10-19: qty 2

## 2020-10-19 MED ORDER — MIDAZOLAM HCL 2 MG/2ML IJ SOLN
INTRAMUSCULAR | Status: DC | PRN
  Administered 2020-10-19: 2 mg via INTRAVENOUS

## 2020-10-19 MED ORDER — ORAL CARE MOUTH RINSE: 15.0000 mL | Freq: Once | OROMUCOSAL | Status: AC

## 2020-10-19 MED ORDER — LACTATED RINGERS IV SOLN: INTRAVENOUS | Status: DC

## 2020-10-19 MED ORDER — CEFAZOLIN SODIUM-DEXTROSE 2-4 GM/100ML-% IV SOLN
2.0000 g | INTRAVENOUS | Status: AC
  Administered 2020-10-19: 2 g via INTRAVENOUS

## 2020-10-19 MED ORDER — FAMOTIDINE 20 MG PO TABS
ORAL_TABLET | ORAL | Status: AC
  Administered 2020-10-19: 20 mg via ORAL
  Filled 2020-10-19: qty 1

## 2020-10-19 MED ORDER — FENTANYL CITRATE (PF) 100 MCG/2ML IJ SOLN
INTRAMUSCULAR | Status: DC | PRN
  Administered 2020-10-19: 100 ug via INTRAVENOUS
  Administered 2020-10-19 (×3): 50 ug via INTRAVENOUS

## 2020-10-19 MED ORDER — LIDOCAINE HCL (CARDIAC) PF 100 MG/5ML IV SOSY
PREFILLED_SYRINGE | INTRAVENOUS | Status: DC | PRN
  Administered 2020-10-19: 100 mg via INTRAVENOUS

## 2020-10-19 MED ORDER — BUPIVACAINE-EPINEPHRINE (PF) 0.25% -1:200000 IJ SOLN
INTRAMUSCULAR | Status: AC
  Filled 2020-10-19: qty 30

## 2020-10-19 MED ORDER — BUPIVACAINE LIPOSOME 1.3 % IJ SUSP
INTRAMUSCULAR | Status: AC
  Filled 2020-10-19: qty 20

## 2020-10-19 MED ORDER — CEFAZOLIN SODIUM-DEXTROSE 2-4 GM/100ML-% IV SOLN
INTRAVENOUS | Status: AC
  Filled 2020-10-19: qty 100

## 2020-10-19 MED ORDER — MIDAZOLAM HCL 2 MG/2ML IJ SOLN
INTRAMUSCULAR | Status: AC
  Filled 2020-10-19: qty 2

## 2020-10-19 MED ORDER — CHLORHEXIDINE GLUCONATE 0.12 % MT SOLN
OROMUCOSAL | Status: AC
  Administered 2020-10-19: 15 mL via OROMUCOSAL
  Filled 2020-10-19: qty 15

## 2020-10-19 MED ORDER — PHENYLEPHRINE HCL (PRESSORS) 10 MG/ML IV SOLN
INTRAVENOUS | Status: AC
  Filled 2020-10-19: qty 1

## 2020-10-19 MED ORDER — ONDANSETRON HCL 4 MG/2ML IJ SOLN
INTRAMUSCULAR | Status: DC | PRN
  Administered 2020-10-19: 4 mg via INTRAVENOUS

## 2020-10-19 MED ORDER — PROPOFOL 10 MG/ML IV BOLUS
INTRAVENOUS | Status: AC
  Filled 2020-10-19: qty 40

## 2020-10-19 MED ORDER — SEVOFLURANE IN SOLN
RESPIRATORY_TRACT | Status: AC
  Filled 2020-10-19: qty 250

## 2020-10-19 MED ORDER — FENTANYL CITRATE (PF) 250 MCG/5ML IJ SOLN
INTRAMUSCULAR | Status: AC
  Filled 2020-10-19: qty 5

## 2020-10-19 MED ORDER — ROCURONIUM BROMIDE 100 MG/10ML IV SOLN
INTRAVENOUS | Status: DC | PRN
  Administered 2020-10-19: 10 mg via INTRAVENOUS
  Administered 2020-10-19: 60 mg via INTRAVENOUS

## 2020-10-19 MED ORDER — BUPIVACAINE-EPINEPHRINE 0.25% -1:200000 IJ SOLN
INTRAMUSCULAR | Status: DC | PRN
  Administered 2020-10-19: 30 mL

## 2020-10-19 MED ORDER — PROMETHAZINE HCL 25 MG/ML IJ SOLN: 6.2500 mg | INTRAMUSCULAR | Status: DC | PRN

## 2020-10-19 MED ORDER — ROCURONIUM BROMIDE 10 MG/ML (PF) SYRINGE
PREFILLED_SYRINGE | INTRAVENOUS | Status: AC
  Filled 2020-10-19: qty 20

## 2020-10-19 MED ORDER — POVIDONE-IODINE 7.5 % EX SOLN
Freq: Once | CUTANEOUS | Status: DC
  Filled 2020-10-19: qty 118

## 2020-10-19 MED ORDER — OXYCODONE HCL 5 MG PO TABS
ORAL_TABLET | ORAL | Status: AC
  Filled 2020-10-19: qty 1

## 2020-10-19 MED ORDER — OXYCODONE HCL 5 MG PO TABS
5.0000 mg | ORAL_TABLET | Freq: Once | ORAL | Status: AC | PRN
  Administered 2020-10-19: 5 mg via ORAL

## 2020-10-19 MED ORDER — DEXAMETHASONE SODIUM PHOSPHATE 10 MG/ML IJ SOLN
INTRAMUSCULAR | Status: DC | PRN
  Administered 2020-10-19: 10 mg via INTRAVENOUS

## 2020-10-19 MED ORDER — SUGAMMADEX SODIUM 200 MG/2ML IV SOLN
INTRAVENOUS | Status: DC | PRN
  Administered 2020-10-19: 200 mg via INTRAVENOUS

## 2020-10-19 MED ORDER — ONDANSETRON HCL 4 MG/2ML IJ SOLN
INTRAMUSCULAR | Status: AC
  Filled 2020-10-19: qty 2

## 2020-10-19 MED ORDER — FAMOTIDINE 20 MG PO TABS: 20.0000 mg | ORAL_TABLET | Freq: Once | ORAL | Status: AC

## 2020-10-19 MED ORDER — BUPIVACAINE HCL (PF) 0.5 % IJ SOLN
INTRAMUSCULAR | Status: AC
  Filled 2020-10-19: qty 30

## 2020-10-19 MED ORDER — BUPIVACAINE LIPOSOME 1.3 % IJ SUSP
INTRAMUSCULAR | Status: DC | PRN
  Administered 2020-10-19: 20 mL

## 2020-10-19 SURGICAL SUPPLY — 110 items
1.6 x 150 blunt kwire ×2 IMPLANT
ANCH SUT 4.5 FTPRNT PEEK-OPTM (Anchor) ×1 IMPLANT
ANCHOR 4.5 FOOTPRINT ULTRA (Anchor) ×2 IMPLANT
BIT DRILL 2 FENESTRATED (MISCELLANEOUS) ×1 IMPLANT
BIT DRILL 4X4.5 FOOTPRINT STR (BIT) ×1 IMPLANT
BIT DRILL CANNULTD 2.6 X 130MM (DRILL) ×1 IMPLANT
BIT DRILL SOLID 2.0 X 110MM (DRILL) ×1 IMPLANT
BIT DRILLL 2 FENESTRATED (MISCELLANEOUS) ×1
BLADE MED AGGRESSIVE (BLADE) ×2 IMPLANT
BLADE OSCILLATING/SAGITTAL (BLADE)
BLADE SAW 9X31X.051 (BLADE) ×2 IMPLANT
BLADE SURG 15 STRL LF DISP TIS (BLADE) ×2 IMPLANT
BLADE SURG 15 STRL SS (BLADE) ×4
BLADE SURG MINI STRL (BLADE) ×2 IMPLANT
BLADE SW THK.38XMED LNG THN (BLADE) IMPLANT
BNDG CMPR STD VLCR NS LF 5.8X4 (GAUZE/BANDAGES/DRESSINGS) ×1
BNDG COHESIVE 4X5 TAN STRL (GAUZE/BANDAGES/DRESSINGS) ×2 IMPLANT
BNDG CONFORM 2 STRL LF (GAUZE/BANDAGES/DRESSINGS) IMPLANT
BNDG CONFORM 3 STRL LF (GAUZE/BANDAGES/DRESSINGS) ×2 IMPLANT
BNDG ELASTIC 4X5.8 VLCR NS LF (GAUZE/BANDAGES/DRESSINGS) ×2 IMPLANT
BNDG ESMARK 4X12 TAN STRL LF (GAUZE/BANDAGES/DRESSINGS) ×2 IMPLANT
BNDG GAUZE 4.5X4.1 6PLY STRL (MISCELLANEOUS) IMPLANT
BOOT STEPPER DURA MED (SOFTGOODS) ×2 IMPLANT
BUR 4X45 EGG (BURR) IMPLANT
BUR 4X55 1 (BURR) ×2 IMPLANT
CANISTER SUCT 1200ML W/VALVE (MISCELLANEOUS) IMPLANT
COUNTERSICK 4.0 HEADED (MISCELLANEOUS) ×2
COVER PIN YLW 0.028-062 (MISCELLANEOUS) IMPLANT
COVER WAND RF STERILE (DRAPES) IMPLANT
CUFF TOURN SGL QUICK 12 (TOURNIQUET CUFF) IMPLANT
CUFF TOURN SGL QUICK 18X4 (TOURNIQUET CUFF) IMPLANT
CUFF TOURN SGL QUICK 24 (TOURNIQUET CUFF)
CUFF TOURN SGL QUICK 30 (TOURNIQUET CUFF) ×2
CUFF TRNQT CYL 24X4X16.5-23 (TOURNIQUET CUFF) IMPLANT
CUFF TRNQT CYL 30X4X21-28X (TOURNIQUET CUFF) ×1 IMPLANT
DRAPE FLUOR MINI C-ARM 54X84 (DRAPES) ×2 IMPLANT
DRILL 4X4.5 FOOTPRINT STR (BIT) ×2
DRILL CANNULATED 2.6 X 130MM (DRILL) ×2
DRILL SOLID 2.0 X 110MM (DRILL) ×2
DRSG TEGADERM 4X4.75 (GAUZE/BANDAGES/DRESSINGS) ×2 IMPLANT
DURAPREP 26ML APPLICATOR (WOUND CARE) ×4 IMPLANT
ELECT REM PT RETURN 9FT ADLT (ELECTROSURGICAL) ×2
ELECTRODE REM PT RTRN 9FT ADLT (ELECTROSURGICAL) ×1 IMPLANT
GAUZE 4X4 16PLY RFD (DISPOSABLE) ×2 IMPLANT
GAUZE SPONGE 4X4 12PLY STRL (GAUZE/BANDAGES/DRESSINGS) ×2 IMPLANT
GAUZE XEROFORM 1X8 LF (GAUZE/BANDAGES/DRESSINGS) ×4 IMPLANT
GLOVE SURG ENC MOIS LTX SZ7.5 (GLOVE) ×2 IMPLANT
GLOVE SURG UNDER LTX SZ8 (GLOVE) ×2 IMPLANT
GOWN STRL REUS W/ TWL XL LVL3 (GOWN DISPOSABLE) ×2 IMPLANT
GOWN STRL REUS W/TWL XL LVL3 (GOWN DISPOSABLE) ×4
K-WIRE DBL BLUNT SMTH 1.6X150 (Wire) ×2 IMPLANT
K-WIRE SMOOTH TROCAR 2.0X150 (WIRE) ×4
K-WIRE SNGL END 1.2X150 (MISCELLANEOUS) ×2
KIT SHOULDER TRACTION (DRAPES) ×2 IMPLANT
KIT TURNOVER KIT A (KITS) ×2 IMPLANT
KWIRE DBL BLUNT SMTH 1.6X150 (Wire) ×1 IMPLANT
KWIRE SMOOTH TROCAR 2.0X150 (WIRE) ×2 IMPLANT
KWIRE SNGL END 1.2X150 (MISCELLANEOUS) ×1 IMPLANT
LABEL OR SOLS (LABEL) ×2 IMPLANT
MANIFOLD NEPTUNE II (INSTRUMENTS) ×2 IMPLANT
NDL SAFETY ECLIPSE 18X1.5 (NEEDLE) ×1 IMPLANT
NEEDLE FILTER BLUNT 18X 1/2SAF (NEEDLE) ×1
NEEDLE FILTER BLUNT 18X1 1/2 (NEEDLE) ×1 IMPLANT
NEEDLE HYPO 18GX1.5 SHARP (NEEDLE) ×2
NEEDLE HYPO 22GX1.5 SAFETY (NEEDLE) ×2 IMPLANT
NEEDLE HYPO 25X1 1.5 SAFETY (NEEDLE) ×4 IMPLANT
NS IRRIG 500ML POUR BTL (IV SOLUTION) ×2 IMPLANT
PACK EXTREMITY ARMC (MISCELLANEOUS) ×2 IMPLANT
PADDING CAST BLEND 4X4 NS (MISCELLANEOUS) IMPLANT
PENCIL ELECTRO HAND CTR (MISCELLANEOUS) ×2 IMPLANT
PLATE GRAFT SPANNING L LAPIDUS (Plate) ×2 IMPLANT
PLATE MEDIUM L 20 (Plate) ×2 IMPLANT
RASP SM TEAR CROSS CUT (RASP) ×2 IMPLANT
SCREW CANN.SHORT HEAD 4.0X32MM (Screw) ×2 IMPLANT
SCREW COUNTERSINK 4.0 HEADED (MISCELLANEOUS) ×1 IMPLANT
SCREW LOCK PLATE R3 2.7X12 (Screw) ×2 IMPLANT
SCREW LOCK PLATE R3 2.7X16 (Screw) ×4 IMPLANT
SCREW LOCK PLATE R3 2.7X20 (Screw) ×2 IMPLANT
SCREW LOCK PLATE R3 2.7X22 (Screw) ×2 IMPLANT
SCREW LOCK PLATE R3 3.5X20 (Screw) ×2 IMPLANT
SCREW NONLOCK PL R3 2.7X11 (Screw) ×2 IMPLANT
SPLINT CAST 1 STEP 4X30 (MISCELLANEOUS) IMPLANT
SPLINT FAST PLASTER 5X30 (CAST SUPPLIES)
SPLINT PLASTER CAST FAST 5X30 (CAST SUPPLIES) IMPLANT
STOCKINETTE M/LG 89821 (MISCELLANEOUS) ×4 IMPLANT
STRAP SAFETY 5IN WIDE (MISCELLANEOUS) ×2 IMPLANT
STRIP CLOSURE SKIN 1/4X4 (GAUZE/BANDAGES/DRESSINGS) ×2 IMPLANT
SUT ETHILON 3-0 (SUTURE) ×2 IMPLANT
SUT MNCRL 4-0 (SUTURE) ×8
SUT MNCRL 4-0 27XMFL (SUTURE) ×4
SUT ULTRABRAID #2 38 (SUTURE) ×2 IMPLANT
SUT VIC AB 2-0 SH 27 (SUTURE) ×4
SUT VIC AB 2-0 SH 27XBRD (SUTURE) ×2 IMPLANT
SUT VIC AB 3-0 SH 27 (SUTURE) ×6
SUT VIC AB 3-0 SH 27X BRD (SUTURE) ×3 IMPLANT
SUT VIC AB 4-0 FS2 27 (SUTURE) ×2 IMPLANT
SUT VICRYL 3-0 CR8 SH (SUTURE) ×4 IMPLANT
SUT VICRYL AB 3-0 FS1 BRD 27IN (SUTURE) ×2 IMPLANT
SUTURE MNCRL 4-0 27XMF (SUTURE) ×4 IMPLANT
SYR 10ML LL (SYRINGE) ×2 IMPLANT
SYR 3ML LL SCALE MARK (SYRINGE) ×2 IMPLANT
TRAY FOLEY MTR SLVR 16FR STAT (SET/KITS/TRAYS/PACK) ×2 IMPLANT
TUBING CONNECTING 10 (TUBING) ×2 IMPLANT
WAND TOPAZ MICRO DEBRIDER (MISCELLANEOUS) ×2 IMPLANT
WEDGE BONE 20MMH X 22MMD 8MMT (Tissue) ×2 IMPLANT
WEDGE LAPIDUS 8MM (Bone Implant) ×2 IMPLANT
WIRE MAGNUM (SUTURE) ×2 IMPLANT
WIRE OLIVE SMOOTH 1.4MMX60MM (WIRE) ×4 IMPLANT
WIRE Z .045 C-WIRE SPADE TIP (WIRE) IMPLANT
WIRE Z .062 C-WIRE SPADE TIP (WIRE) IMPLANT

## 2020-10-19 NOTE — Discharge Instructions (Addendum)
[place] DR. TROXLER, DR. Vickki Muff, AND DR. [place]   1. Take your medication as prescribed.  Pain medication should be taken only as needed.  2. Keep the dressing clean, dry and intact.  3. Keep your foot elevated above the heart level for the first 48 hours.  4. We have instructed you to be non-weight bearing.  5. Always wear your post-op shoe when walking.  Always use your crutches if you are to be non-weight bearing.  6. Do not take a shower. Baths are permissible as long as the foot is kept out of the water.   7. Every hour you are awake:  - Bend your knee 15 times. - Flex foot 15 times - Massage calf 15 times  8. Call [facility] [phone]) if any of the following problems occur: - You develop a temperature or fever. - The bandage becomes saturated with blood. - Medication does not stop your pain. - Injury of the foot occurs. - Any symptoms of infection including redness, odor, or red streaks running from wound.   Bupivacaine Liposomal Suspension for Injection What is this medicine? BUPIVACAINE LIPOSOMAL (bue PIV a kane LIP oh som al) is an anesthetic. It causes loss of feeling in the skin or other tissues. It is used to prevent and to treat pain from some procedures. This medicine may be used for other purposes; ask your health care provider or pharmacist if you have questions. COMMON BRAND NAME(S): EXPAREL What should I tell my health care provider before I take this medicine? They need to know if you have any of these conditions:  G6PD deficiency  heart disease  kidney disease  liver disease  low blood pressure  lung or breathing disease, like asthma  an unusual or allergic reaction to bupivacaine, other medicines, foods, dyes, or preservatives  pregnant or trying to get pregnant  breast-feeding How should I use this medicine? This  medicine is injected into the affected area. It is given by a health care provider in a hospital or clinic setting. Talk to your health care provider about the use of this medicine in children. While it may be given to children as young as 6 years for selected conditions, precautions do apply. Overdosage: If you think you have taken too much of this medicine contact a poison control center or emergency room at once. NOTE: This medicine is only for you. Do not share this medicine with others. What if I miss a dose? This does not apply. What may interact with this medicine? This medicine may interact with the following medications:  acetaminophen  certain antibiotics like dapsone, nitrofurantoin, aminosalicylic acid, sulfonamides  certain medicines for seizures like phenobarbital, phenytoin, valproic acid  chloroquine  cyclophosphamide  flutamide  hydroxyurea  ifosfamide  metoclopramide  nitric oxide  nitroglycerin  nitroprusside  nitrous oxide  other local anesthetics like lidocaine, pramoxine, tetracaine  primaquine  quinine  rasburicase  sulfasalazine This list may not describe all possible interactions. Give your health care provider a list of all the medicines, herbs, non-prescription drugs, or dietary supplements you use. Also tell them if you smoke, drink alcohol, or use illegal drugs. Some items may interact with your medicine. What should I watch for while using this medicine? Your condition will be monitored carefully while you are receiving this medicine. Be careful to avoid injury while the area is numb, and you are not aware of pain. What  side effects may I notice from receiving this medicine? Side effects that you should report to your doctor or health care professional as soon as possible:  allergic reactions like skin rash, itching or hives, swelling of the face, lips, or tongue  seizures  signs and symptoms of a dangerous change in heartbeat or  heart rhythm like chest pain; dizziness; fast, irregular heartbeat; palpitations; feeling faint or lightheaded; falls; breathing problems  signs and symptoms of methemoglobinemia such as pale, gray, or blue colored skin; headache; fast heartbeat; shortness of breath; feeling faint or lightheaded, falls; tiredness Side effects that usually do not require medical attention (report to your doctor or health care professional if they continue or are bothersome):  anxious  back pain  changes in taste  changes in vision  constipation  dizziness  fever  nausea, vomiting This list may not describe all possible side effects. Call your doctor for medical advice about side effects. You may report side effects to FDA at 1-800-FDA-1088. Where should I keep my medicine? This drug is given in a hospital or clinic and will not be stored at home. NOTE: This sheet is a summary. It may not cover all possible information. If you have questions about this medicine, talk to your doctor, pharmacist, or health care provider.  2021 Elsevier/Gold Standard (2019-09-22 12:24:57)  AMBULATORY SURGERY  DISCHARGE INSTRUCTIONS   1) The drugs that you were given will stay in your system until tomorrow so for the next 24 hours you should not:  A) Drive an automobile B) Make any legal decisions C) Drink any alcoholic beverage   2) You may resume regular meals tomorrow.  Today it is better to start with liquids and gradually work up to solid foods.  You may eat anything you prefer, but it is better to start with liquids, then soup and crackers, and gradually work up to solid foods.   3) Please notify your doctor immediately if you have any unusual bleeding, trouble breathing, redness and pain at the surgery site, drainage, fever, or pain not relieved by medication.    4) Additional Instructions:        Please contact your physician with any problems or Same Day Surgery at [phone], Monday  through Friday 6 am to 4 pm, or [place] at [facility] number at [phone].

## 2020-10-19 NOTE — Anesthesia Preprocedure Evaluation (Signed)
Anesthesia Evaluation  Patient identified by MRN, date of birth, ID band Patient awake    Reviewed: Allergy & Precautions, NPO status , Patient's Chart, lab work & pertinent test results  History of Anesthesia Complications Negative for: history of anesthetic complications  Airway Mallampati: II  TM Distance: >3 FB Neck ROM: Full    Dental no notable dental hx.    Pulmonary neg pulmonary ROS, neg sleep apnea, neg COPD,    breath sounds clear to auscultation- rhonchi (-) wheezing      Cardiovascular Exercise Tolerance: Good (-) hypertension(-) CAD, (-) Past MI, (-) Cardiac Stents and (-) CABG  Rhythm:Regular Rate:Normal - Systolic murmurs and - Diastolic murmurs    Neuro/Psych neg Seizures PSYCHIATRIC DISORDERS Depression negative neurological ROS     GI/Hepatic Neg liver ROS, PUD,   Endo/Other  negative endocrine ROSneg diabetes  Renal/GU negative Renal ROS     Musculoskeletal negative musculoskeletal ROS (+)   Abdominal (+) + obese,   Peds  Hematology negative hematology ROS (+)   Anesthesia Other Findings Past Medical History: No date: Endometriosis No date: Obesity No date: Orthodontics     Comment:  permanent bottom retainer No date: Vitamin D deficiency disease   Reproductive/Obstetrics                             Anesthesia Physical Anesthesia Plan  ASA: II  Anesthesia Plan: General   Post-op Pain Management:    Induction: Intravenous  PONV Risk Score and Plan: 2 and Ondansetron, Dexamethasone and Midazolam  Airway Management Planned: Oral ETT  Additional Equipment:   Intra-op Plan:   Post-operative Plan: Extubation in OR  Informed Consent: I have reviewed the patients History and Physical, chart, labs and discussed the procedure including the risks, benefits and alternatives for the proposed anesthesia with the patient or authorized representative who has  indicated his/her understanding and acceptance.     Dental advisory given  Plan Discussed with: CRNA and Anesthesiologist  Anesthesia Plan Comments:         Anesthesia Quick Evaluation

## 2020-10-19 NOTE — Transfer of Care (Signed)
Immediate Anesthesia Transfer of Care Note  Patient: Linda Jimenez  Procedure(s) Performed: GASTROC RECESSION, LEFT (Left ) FDL TRANSFER; DEEP, LEFT (Left ) EVANS/MCDO, LEFT (Left ) HALLUX VALGUS LAPIDUS-TYPE, LEFT (Left )  Patient Location: PACU  Anesthesia Type:General  Level of Consciousness: awake  Airway & Oxygen Therapy: Patient Spontanous Breathing  Post-op Assessment: Report given to RN  Post vital signs: stable  Last Vitals:  Vitals Value Taken Time  BP 150/73 10/19/20 1209  Temp    Pulse 94 10/19/20 1213  Resp 12 10/19/20 1213  SpO2 96 % 10/19/20 1213  Vitals shown include unvalidated device data.  Last Pain:  Vitals:   10/19/20 0626  TempSrc: Temporal  PainSc: 0-No pain         Complications: No complications documented.

## 2020-10-19 NOTE — Anesthesia Procedure Notes (Signed)
Procedure Name: Intubation Date/Time: 10/19/2020 7:43 AM Performed by: Leander Rams, CRNA Pre-anesthesia Checklist: Patient identified, Emergency Drugs available, Suction available, Patient being monitored and Timeout performed Patient Re-evaluated:Patient Re-evaluated prior to induction Oxygen Delivery Method: Circle system utilized Preoxygenation: Pre-oxygenation with 100% oxygen Induction Type: IV induction Ventilation: Mask ventilation without difficulty Laryngoscope Size: McGraph and 3 Grade View: Grade I Tube type: Oral Tube size: 7.0 mm Number of attempts: 1 Airway Equipment and Method: Stylet Placement Confirmation: ETT inserted through vocal cords under direct vision,  positive ETCO2,  CO2 detector and breath sounds checked- equal and bilateral Secured at: 21 cm Tube secured with: Tape Dental Injury: Teeth and Oropharynx as per pre-operative assessment

## 2020-10-19 NOTE — H&P (Signed)
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  The various methods of treatment have been discussed with the patient.  No guarantees were given.  After consideration of risks, benefits and other options for treatment, the patient has consented to surgery.  I have reviewed the patients' chart and labs.     A history and physical examination was performed in my office.  The patient was reexamined.  There have been no changes to this history and physical examination.  Elesa Hacker ]

## 2020-10-19 NOTE — Op Note (Signed)
Operative note   Surgeon:Michon Kaczmarek Lawyer: None    Preop diagnosis: 1.  Gastroc equinus left lower extremity 2.  Posterior tibial dysfunction left foot 3.  Hallux valgus deformity left foot    Postop diagnosis: Same    Procedure:1.  Rica Mote gastroc recession left lower extremity 2.  Evans calcaneal osteotomy left calcaneus 3.  Flexor digitorum longus tendon transfer left medial ankle 4.  First metatarsocuneiform arthrodesis 5.  Silver bunionectomy medial first metatarsal head    EBL: Minimal    Anesthesia:local and general.  Local consisted of a mixture of 3-2 0.25% bupivacaine with epinephrine and Exparel long-acting anesthetic.  This was infiltrated along all surgical sites    Hemostasis: Thigh tourniquet inflated to 250 mmHg for 120-minute    Specimen: None    Complications: None    Operative indications:Linda Jimenez is an 45 y.o. that presents today for surgical intervention.  The risks/benefits/alternatives/complications have been discussed and consent has been given.    Procedure:  Patient was brought into the OR and placed on the operating table in theprone position. After anesthesia was obtained theleft lower extremity was prepped and draped in usual sterile fashion.  Attention was directed to the posterior aspect of the left lower leg where at the gastroc soleal junction a longitudinal incision was made.  Sharp and blunt dissection carried down to the peritenon.  This was then incised.  The gastroc recession was then performed from medial to lateral exposing the underlying muscle belly.  Good excursion was noted.  The wound was flushed with copious amounts of irrigation.  Closure was then performed with a 4-0 Vicryl the peritenon and subcutaneous tissue and a 4-0 Monocryl for skin.  This area was then covered and Sterile.  All dressings were removed and patient was repositioned in the supine position.  Re-sterile prep and drape was then performed.  Attention was  directed to the lateral aspect of the calcaneus where just proximal to the calcaneocuboid joint a longitudinal incision performed in line with the peroneal tendons.  Sharp and blunt dissection carried down to the peroneal tendons.  The sural nerve was noted and retracted throughout the procedure.  The peroneal tendons were retracted throughout the procedure.  Approximately 1 cm proximal to the calcaneocuboid joint a lateral to medial osteotomy was created.  This was opened.  An 8 mm Paragon Evans calcaneal wedge was then sized and placed within the osteotomy site.  This was then stabilized with a medium Evans plate laterally on the calcaneus.  Good stability and realignment of the foot was noted at this time.  The wound was flushed with copious amounts of irrigation.  Closure was performed with 3-0 and 4-0 Vicryl and a 4-0 Monocryl for skin.  Attention was then directed to the medial aspect of the ankle course along the posterior tibial tendon from the retromalleolar groove to the navicular tuberosity.  Sharp and blunt dissection carried down through the tendon sheath.  The posterior tibial tendon sheath was then entered.  There was noted to be fraying at the insertion.  This was then excised down.  Loose bone was noted at the navicular tuberosity and this was then excised.  A prominent medial eminence was noted and this was then excised with a power saw and smoothed with a power rasp.  The wound was flushed with copious amounts of irrigation.  At this time the flexor digitorum longus was then dissected out.  This was cut deep into the arch.  This was then placed into the navicular region with a 5.0 mm footprint bone anchor from [employer]  Good stability was noted.  The tendon was then further anchored to the soft tissue with a 3-0 and 2-0 Vicryl.  The posterior tibial tendon with the fraying was debrided and repaired and also reanastomosed to the surrounding soft tissue in this area.  The posterior tibial  tendon was then infiltrated with the Topaz wand along the length of it into the ankle region.  All wounds were flushed with copious amounts of irrigation.  Closure was performed with 3-0 and 4-0 Vicryl and a 4-0 Monocryl for skin.  Attention was then directed to the dorsal first met cuneiform joint where a longitudinal incision was performed.  Sharp and blunt dissection were carried down to the first met cuneiform joint region.  At this time the joint was then exposed.  Next a Paragon 10 mm closing cut guide was placed into the joint.  2 small wafers of bone from the base of the metatarsal and distal medial cuneiform were then excised removing all of the articular cartilage.  The joint was then prepped with a 2.0 mm drill bit.  Next an 8 mm Paragon Lapidus wedge was placed into the first met cuneiform joint for stability and realignment maintaining the length of the first metatarsal.  This was stabilized with a dorsal medial plate.  A compression 4.0 millimeter screw was placed.  Good realignment of the intermetatarsal angle was noted.  There was still just a little bit of prominent medial eminence.  At this time I elected to perform a small silver bunionectomy.  A medial incision was made over the first metatarsal head.  Sharp and blunt ice was carried down to the capsule.  Longitudinal capsulotomy was performed.  The medial eminence was noted and transected.  This was flushed.  This was smoothed with a power rasp.  Flushed again.  Closure of the capsule was performed with 3-0 Vicryl and 4-0 Vicryl and the skin with a 4-0 Monocryl.  All areas were then dressed appropriately.  Patient was placed in a neutral position in an equalizer walker boot.     Patient tolerated the procedure and anesthesia well.  Was transported from the OR to the PACU with all vital signs stable and vascular status intact. To be discharged per routine protocol.  Will follow up in approximately 1 week in the outpatient clinic.

## 2020-10-19 NOTE — Anesthesia Postprocedure Evaluation (Signed)
Anesthesia Post Note  Patient: Linda Jimenez  Procedure(s) Performed: GASTROC RECESSION, LEFT (Left ) FDL TRANSFER; DEEP, LEFT (Left ) EVANS/MCDO, LEFT (Left ) HALLUX VALGUS LAPIDUS-TYPE, LEFT (Left )  Patient location during evaluation: PACU Anesthesia Type: General Level of consciousness: awake and alert and oriented Pain management: pain level controlled Vital Signs Assessment: post-procedure vital signs reviewed and stable Respiratory status: spontaneous breathing, nonlabored ventilation and respiratory function stable Cardiovascular status: blood pressure returned to baseline and stable Postop Assessment: no signs of nausea or vomiting Anesthetic complications: no   No complications documented.   Last Vitals:  Vitals:   10/19/20 1300 10/19/20 1316  BP: 131/85 (!) 150/75  Pulse: 99 98  Resp: 15 16  Temp: (!) 36.3 C 36.5 C  SpO2: 98% 98%    Last Pain:  Vitals:   10/19/20 1316  TempSrc: Temporal  PainSc: 3                  Aislyn Hayse

## 2020-10-23 ENCOUNTER — Encounter: Payer: Self-pay | Admitting: Podiatry

## 2020-10-24 ENCOUNTER — Encounter: Payer: Self-pay | Admitting: Podiatry

## 2021-08-29 ENCOUNTER — Other Ambulatory Visit: Payer: Self-pay | Admitting: Internal Medicine

## 2021-08-29 DIAGNOSIS — Z1231 Encounter for screening mammogram for malignant neoplasm of breast: Secondary | ICD-10-CM

## 2021-10-04 ENCOUNTER — Ambulatory Visit
Admission: RE | Admit: 2021-10-04 | Discharge: 2021-10-04 | Disposition: A | Discharge status: Home / Self Care | Payer: 59 | Source: Ambulatory Visit | Attending: Internal Medicine | Admitting: Internal Medicine

## 2021-10-04 DIAGNOSIS — Z1231 Encounter for screening mammogram for malignant neoplasm of breast: Secondary | ICD-10-CM | POA: Insufficient documentation

## 2021-11-12 ENCOUNTER — Encounter: Payer: Self-pay | Admitting: Emergency Medicine

## 2021-11-12 ENCOUNTER — Other Ambulatory Visit: Payer: Self-pay

## 2021-11-12 ENCOUNTER — Ambulatory Visit
Admission: EM | Admit: 2021-11-12 | Discharge: 2021-11-12 | Disposition: A | Discharge status: Home / Self Care | Payer: 59 | Attending: Emergency Medicine | Admitting: Emergency Medicine

## 2021-11-12 DIAGNOSIS — J069 Acute upper respiratory infection, unspecified: Secondary | ICD-10-CM

## 2021-11-12 DIAGNOSIS — J029 Acute pharyngitis, unspecified: Secondary | ICD-10-CM | POA: Diagnosis present

## 2021-11-12 LAB — GROUP A STREP BY PCR: Group A Strep by PCR: NOT DETECTED

## 2021-11-12 MED ORDER — IPRATROPIUM BROMIDE 0.06 % NA SOLN: 2.0000 | Freq: Four times a day (QID) | NASAL | 12 refills | Status: AC

## 2021-11-12 NOTE — Discharge Instructions (Addendum)
Your strep test today was negative.  I believe your sore throat is a result of the postnasal drip coming from your nasal congestion. ? ?Use the Atrovent nasal spray, 2 squirts up each nostril every 6 hours, as needed for nasal congestion and postnasal drip. ? ?Gargle with warm salt water 2-3 times a day to soothe your throat, aid in pain relief, and aid in healing. ? ?Take over-the-counter ibuprofen according to the package instructions as needed for pain. ? ?You can also use Chloraseptic or Sucrets lozenges, 1 lozenge every 2 hours as needed for throat pain. ? ?If you develop any new or worsening symptoms return for reevaluation.  ?

## 2021-11-12 NOTE — ED Triage Notes (Signed)
Symptoms started last night.  Patient does not notice drainage.  Reports sore throat.  Scratchy, painful sore throat.   ?

## 2021-11-12 NOTE — ED Provider Notes (Signed)
?[place] ? ? ? ?CSN: [id number] ?Arrival date & time: 11/12/21  3716 ? ? ?  ? ?History   ?Chief Complaint ?Chief Complaint  ?Patient presents with  ? Sore Throat  ? ? ?HPI ?Linda SILVESTRO is a 46 y.o. female.  ? ?HPI ? ?46 year old female here for evaluation of sore throat. ? ?Patient reports that her sore throat started last night and initially it was on the left side under her jaw.  She states she felt like the area was swollen and this morning it has migrated to the right side.  She states that she has had a runny nose but she denies fever, ear pain or pressure, headache, cough, or abdominal pain.  Her grandson is currently sick with an upper respiratory illness.  He does attend daycare and there have been outbreaks of both strep and COVID.  Patient did a home COVID test prior to coming in and it was negative. ? ?Past Medical History:  ?Diagnosis Date  ? Endometriosis   ? Obesity   ? Orthodontics   ? permanent bottom retainer  ? Vitamin D deficiency disease   ? ? ?Patient Active Problem List  ? Diagnosis Date Noted  ? Indeterminate colitis   ? Recurrent major depressive disorder, in partial remission ([place]) 06/06/2019  ? Ulcerative pancolitis without complication ([place]) 96/78/9381  ? Carotid bruit 03/25/2018  ? Vitamin D deficiency 12/09/2016  ? Fibroadenoma of left breast 09/05/2013  ? ? ?Past Surgical History:  ?Procedure Laterality Date  ? ABDOMINAL HYSTERECTOMY    ? AUGMENTATION MAMMAPLASTY Bilateral 2004  ? BREAST BIOPSY Left September 2014  ? Left breast, 11:00 core biopsy Fibroadenoma  ? COLONOSCOPY N/A 05/14/2015  ? Procedure: COLONOSCOPY;  Surgeon: Hulen Luster, MD;  Location: [facility] ENDOSCOPY;  Service: Gastroenterology;  Laterality: N/A;  ? COLONOSCOPY WITH PROPOFOL N/A 09/09/2019  ? Procedure: COLONOSCOPY WITH PROPOFOL;  Surgeon: [name], MD;  Location: [place];  Service: Endoscopy;  Laterality: N/A;  ? FLAT FOOT CORRECTION Left 10/19/2020  ? Procedure: EVANS/MCDO, LEFT;   Surgeon: Samara Deist, DPM;  Location: [facility] ORS;  Service: Podiatry;  Laterality: Left;  ? FLAT FOOT RECONSTRUCTION-TAL GASTROC RECESSION Left 10/19/2020  ? Procedure: GASTROC RECESSION, LEFT;  Surgeon: Samara Deist, DPM;  Location: [facility] ORS;  Service: Podiatry;  Laterality: Left;  ? HALLUX VALGUS LAPIDUS Left 10/19/2020  ? Procedure: HALLUX VALGUS LAPIDUS-TYPE, LEFT;  Surgeon: Samara Deist, DPM;  Location: [facility] ORS;  Service: Podiatry;  Laterality: Left;  ? PLACEMENT OF BREAST IMPLANTS  2004  ? Dr. Cathie Hoops  ? TENDON TRANSFER Left 10/19/2020  ? Procedure: FDL TRANSFER; DEEP, LEFT;  Surgeon: Samara Deist, DPM;  Location: [facility] ORS;  Service: Podiatry;  Laterality: Left;  ? ? ?OB History   ? ? Gravida  ?2  ? Para  ?2  ? Term  ?   ? Preterm  ?   ? AB  ?   ? Living  ?2  ?  ? ? SAB  ?   ? IAB  ?   ? Ectopic  ?   ? Multiple  ?   ? Live Births  ?   ?   ?  ? Obstetric Comments  ?1st Menstrual Cycle:  10 ?1st Pregnancy:  21  ?  ? ?  ? ? ? ?Home Medications   ? ?Prior to Admission medications   ?Medication Sig Start Date End Date Taking? Authorizing Provider  ?ipratropium (ATROVENT) 0.06 % nasal spray Place 2 sprays into  both nostrils 4 (four) times daily. 11/12/21  Yes Margarette Canada, NP  ?cetirizine (ZYRTEC) 10 MG tablet Take 10 mg by mouth daily as needed for allergies. ?Patient not taking: Reported on 11/12/2021    [provider]  ?FLUoxetine (PROZAC) 40 MG capsule Take 1 capsule (40 mg total) by mouth daily. 12/13/18   Joylene Igo, CNM  ?oxyCODONE-acetaminophen (PERCOCET) 5-325 MG tablet Take 1-2 tablets by mouth every 6 (six) hours as needed for severe pain. Max 6 tabs per day ?Patient not taking: Reported on 11/12/2021 10/19/20   Samara Deist, DPM  ?phentermine (ADIPEX-P) 37.5 MG tablet Take 37.5 mg by mouth daily. ?Patient not taking: Reported on 11/12/2021 09/25/20   [provider]  ?Vitamin D, Ergocalciferol, (DRISDOL) 1.25 MG (50000 UNIT) CAPS capsule Take 50,000 Units by mouth 2 (two) times a  week. Mon and Fri 09/02/20   [provider]  ?VYVANSE 50 MG capsule Take 50 mg by mouth every morning. 11/01/21   [provider]  ? ? ?Family History ?Family History  ?Problem Relation Age of Onset  ? Healthy Mother   ? Breast cancer Neg Hx   ? ? ?Social History ?Social History  ? ?Tobacco Use  ? Smoking status: Never  ? Smokeless tobacco: Never  ?Vaping Use  ? Vaping Use: Never used  ?Substance Use Topics  ? Alcohol use: Yes  ?  Comment: occas  ? Drug use: No  ? ? ? ?Allergies   ?Patient has no known allergies. ? ? ?Review of Systems ?Review of Systems  ?Constitutional:  Negative for fever.  ?HENT:  Positive for congestion, rhinorrhea and sore throat. Negative for ear pain.   ?Respiratory:  Negative for cough.   ?Gastrointestinal:  Negative for abdominal pain, diarrhea, nausea and vomiting.  ?Skin:  Negative for rash.  ?Neurological:  Negative for headaches.  ?Hematological: Negative.   ?Psychiatric/Behavioral: Negative.    ? ? ?Physical Exam ?Triage Vital Signs ?ED Triage Vitals [11/12/21 0815]  ?Enc Vitals Group  ?   BP   ?   Pulse   ?   Resp   ?   Temp   ?   Temp src   ?   SpO2   ?   Weight   ?   Height   ?   Head Circumference   ?   Peak Flow   ?   Pain Score 5  ?   Pain Loc   ?   Pain Edu?   ?   Excl. in [place]?   ? ?No data found. ? ?Updated Vital Signs ?BP (!) 154/96 (BP Location: Left Arm)   Pulse (!) 108   Temp 98.7 ?F (37.1 ?C) (Oral)   Resp 20   LMP  (LMP Unknown)   SpO2 100%  ? ?Visual Acuity ?Right Eye Distance:   ?Left Eye Distance:   ?Bilateral Distance:   ? ?Right Eye Near:   ?Left Eye Near:    ?Bilateral Near:    ? ?Physical Exam ?Vitals and nursing note reviewed.  ?Constitutional:   ?   Appearance: Normal appearance. She is not ill-appearing.  ?HENT:  ?   Head: Normocephalic and atraumatic.  ?   Right Ear: Tympanic membrane, ear canal and external ear normal. There is no impacted cerumen.  ?   Left Ear: Tympanic membrane, ear canal and external ear normal. There is no impacted  cerumen.  ?   Nose: Congestion and rhinorrhea present.  ?   Mouth/Throat:  ?  Pharynx: Oropharynx is clear. Posterior oropharyngeal erythema present. No oropharyngeal exudate.  ?Cardiovascular:  ?   Rate and Rhythm: Normal rate and regular rhythm.  ?   Pulses: Normal pulses.  ?   Heart sounds: Normal heart sounds. No murmur heard. ?  No friction rub. No gallop.  ?Pulmonary:  ?   Effort: Pulmonary effort is normal.  ?   Breath sounds: Normal breath sounds. No wheezing, rhonchi or rales.  ?Musculoskeletal:  ?   Cervical back: Normal range of motion and neck supple.  ?Lymphadenopathy:  ?   Cervical: No cervical adenopathy.  ?Skin: ?   General: Skin is warm and dry.  ?   Capillary Refill: Capillary refill takes less than 2 seconds.  ?   Findings: No erythema or rash.  ?Neurological:  ?   General: No focal deficit present.  ?   Mental Status: She is alert and oriented to person, place, and time.  ?Psychiatric:     ?   Mood and Affect: Mood normal.     ?   Behavior: Behavior normal.     ?   Thought Content: Thought content normal.     ?   Judgment: Judgment normal.  ? ? ? ?UC Treatments / Results  ?Labs ?(all labs ordered are listed, but only abnormal results are displayed) ?Labs Reviewed  ?GROUP A STREP BY PCR  ? ? ?EKG ? ? ?Radiology ?No results found. ? ?Procedures ?Procedures (including critical care time) ? ?Medications Ordered in UC ?Medications - No data to display ? ?Initial Impression / Assessment and Plan / UC Course  ?I have reviewed the triage vital signs and the nursing notes. ? ?Pertinent labs & imaging results that were available during my care of the patient were reviewed by me and considered in my medical decision making (see chart for details). ? ?Patient is a very pleasant, nontoxic-appearing 46 year old female with a history of vitamin D deficiency, endometriosis, and various orthopedic issues.  She also has a history of colitis.  She is presenting today for evaluation of a sore throat that began last  night as outlined in HPI above.  She does have some associated congestion runny nose but no other significant upper or lower respiratory symptoms.  Her physical exam reveals pearly-gray tympanic membranes bilaterall

## 2022-01-22 ENCOUNTER — Other Ambulatory Visit: Payer: Self-pay

## 2022-01-22 ENCOUNTER — Emergency Department
Admission: EM | Admit: 2022-01-22 | Discharge: 2022-01-22 | Disposition: A | Discharge status: Home / Self Care | Payer: 59 | Attending: Emergency Medicine | Admitting: Emergency Medicine

## 2022-01-22 ENCOUNTER — Emergency Department: Payer: 59

## 2022-01-22 ENCOUNTER — Encounter: Payer: Self-pay | Admitting: Emergency Medicine

## 2022-01-22 DIAGNOSIS — I10 Essential (primary) hypertension: Secondary | ICD-10-CM | POA: Diagnosis not present

## 2022-01-22 DIAGNOSIS — R079 Chest pain, unspecified: Secondary | ICD-10-CM

## 2022-01-22 DIAGNOSIS — R0789 Other chest pain: Secondary | ICD-10-CM | POA: Diagnosis present

## 2022-01-22 LAB — CBC
HCT: 40.8 % (ref 36.0–46.0)
Hemoglobin: 13.1 g/dL (ref 12.0–15.0)
MCH: 29.7 pg (ref 26.0–34.0)
MCHC: 32.1 g/dL (ref 30.0–36.0)
MCV: 92.5 fL (ref 80.0–100.0)
Platelets: 358 10*3/uL (ref 150–400)
RBC: 4.41 MIL/uL (ref 3.87–5.11)
RDW: 12.3 % (ref 11.5–15.5)
WBC: 7.2 10*3/uL (ref 4.0–10.5)
nRBC: 0 % (ref 0.0–0.2)

## 2022-01-22 LAB — BASIC METABOLIC PANEL
Anion gap: 10 (ref 5–15)
BUN: 15 mg/dL (ref 6–20)
CO2: 25 mmol/L (ref 22–32)
Calcium: 9 mg/dL (ref 8.9–10.3)
Chloride: 104 mmol/L (ref 98–111)
Creatinine, Ser: 0.61 mg/dL (ref 0.44–1.00)
GFR, Estimated: >60 mL/min (ref >60)
Glucose, Bld: 120 mg/dL — ABNORMAL HIGH (ref 70–99)
Potassium: 3.6 mmol/L (ref 3.5–5.1)
Sodium: 139 mmol/L (ref 135–145)

## 2022-01-22 LAB — TROPONIN I (HIGH SENSITIVITY)
Troponin I (High Sensitivity): 2 ng/L (ref <18)
Troponin I (High Sensitivity): 2 ng/L (ref <18)

## 2022-01-22 MED ORDER — LORAZEPAM 1 MG PO TABS
1.0000 mg | ORAL_TABLET | Freq: Once | ORAL | Status: AC
  Administered 2022-01-22: 1 mg via ORAL
  Filled 2022-01-22: qty 1

## 2022-01-22 NOTE — ED Triage Notes (Signed)
C/O several week history of elevated heart rate.  Today at work, also having high blood pressure and hands feeling tingling and clammy.  Currently symptoms less.  AAOx3.  Skin warm and dry. NAD.  No SOB/ DOE

## 2022-01-22 NOTE — ED Provider Notes (Signed)
[facility] Provider Note    Event Date/Time   First MD Initiated Contact with Patient 01/22/22 1904     (approximate)  History   Chief Complaint: Tachycardia  HPI  Linda Jimenez is a 46 y.o. female with a past medical history of endometriosis presents to the emergency department for elevated heart rate clamminess and chest pressure.  According to the patient she was at work today when she began feeling clamminess in both of her hands.  Patient states she became concerned so she checked her blood pressure was initially 170/90 or so and then increased to 180/100.  Patient states she was feeling some chest pressure at that time as well she became concerned so she came to the emergency department for evaluation.  Physical Exam   Triage Vital Signs: ED Triage Vitals  Enc Vitals Group     BP 01/22/22 1740 (!) 162/91     Pulse Rate 01/22/22 1740 98     Resp 01/22/22 1740 16     Temp 01/22/22 1740 98.9 F (37.2 C)     Temp Source 01/22/22 1740 Oral     SpO2 01/22/22 1740 98 %     Weight 01/22/22 1741 224 lb (101.6 kg)     Height 01/22/22 1741 '5\' 8"'$  (1.727 m)     Head Circumference --      Peak Flow --      Pain Score 01/22/22 1755 6     Pain Loc --      Pain Edu? --      Excl. in [place]? --     Most recent vital signs: Vitals:   01/22/22 1740  BP: (!) 162/91  Pulse: 98  Resp: 16  Temp: 98.9 F (37.2 C)  SpO2: 98%    General: Awake, no distress.  CV:  Good peripheral perfusion.  Regular rate and rhythm  Resp:  Normal effort.  Equal breath sounds bilaterally.  Abd:  No distention.  Soft, nontender.  No rebound or guarding. Other:  No lower extremity IMA or tenderness to palpation   ED Results / Procedures / Treatments   EKG  EKG viewed and interpreted by myself shows a normal sinus rhythm at 96 bpm with a narrow QRS, right axis deviation, largely normal intervals, right bundle branch block.  Largely unchanged from 2018.  No ST  elevation.  RADIOLOGY  I have viewed and interpreted the chest x-ray images no obvious abnormality seen on my evaluation. Chest x-ray read as negative   MEDICATIONS ORDERED IN ED: Medications  LORazepam (ATIVAN) tablet 1 mg (has no administration in time range)     IMPRESSION / MDM / ASSESSMENT AND PLAN / ED COURSE  I reviewed the triage vital signs and the nursing notes.  Patient's presentation is most consistent with acute presentation with potential threat to life or bodily function.  Patient presents to the emergency department for chest pressure clamminess in her hands feeling like her heart was racing and high blood pressure.  Overall the patient appears well still states mild chest pressure.  Blood pressure currently 162/91.  Patient states normally she does not have high blood pressure.  Patient denies any shortness of breath.  No nausea.  Overall the patient appears well with reassuring work-up thus far.  Lab work is reassuring occluding normal CBC, chemistry and a negative troponin.  Chest x-ray is clear and EKG is unchanged.  However given the patient's continued mild chest discomfort we will obtain a repeat  heart enzyme as a precaution.  We will also dose a small amount of Ativan his symptoms could be suggestive of anxiety.  Patient agreeable to plan of care.  FINAL CLINICAL IMPRESSION(S) / ED DIAGNOSES   Chest pain Hypertension   Note:  This document was prepared using Dragon voice recognition software and may include unintentional dictation errors.   Harvest Dark, MD 01/22/22 1925

## 2022-01-22 NOTE — ED Provider Notes (Signed)
-----------------------------------------   9:05 PM on 01/22/2022 -----------------------------------------  Blood pressure (!) 162/91, pulse 98, temperature 98.9 F (37.2 C), temperature source Oral, resp. rate 16, height '5\' 8"'$  (1.727 m), weight 101.6 kg, SpO2 98 %.  Assuming care from Dr. Kerman Passey.  In short, Linda Jimenez is a 46 y.o. female with a chief complaint of Tachycardia .  Refer to the original H&P for additional details.  The current plan of care is to await second troponin, reevaluate the patient.  Patient's second troponin is reassuring at this time.  Her blood pressure has decreased to 148/80.  Patient is not experiencing chest pain at this time.  She did receive a dose of Ativan which seems to improve the symptoms.  At this time no further work-up.  I will not start any medications for the patient.  I will refer the patient to cardiology for follow-up.   ED diagnosis:  Nonspecific chest pain.    Brynda Peon 01/22/22 2115    Harvest Dark, MD 01/23/22 1911

## 2022-01-22 NOTE — ED Notes (Signed)
Pt signed esignature. 

## 2023-01-15 IMAGING — MG DIGITAL SCREENING BREAST BILAT IMPLANT W/ TOMO W/ CAD
8 of 13 series · 8 of 29 positions shown · non-contrast
Comparison: Previous exam(s).

CLINICAL DATA: Screening.

EXAM:
DIGITAL SCREENING BILATERAL MAMMOGRAM WITH IMPLANTS, CAD AND
TOMOSYNTHESIS
TECHNIQUE: Bilateral screening digital craniocaudal and mediolateral oblique
mammograms were obtained. Bilateral screening digital breast
tomosynthesis was performed. The images were evaluated with
computer-aided detection. Standard and/or implant displaced views
were performed.

[L CC (1 of 2)]
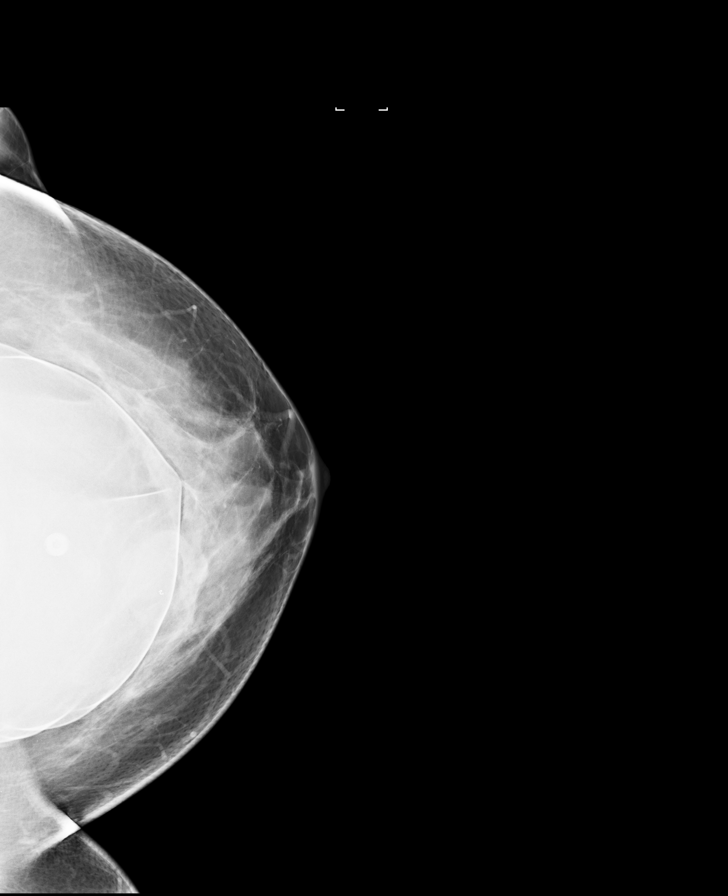

[R CC]
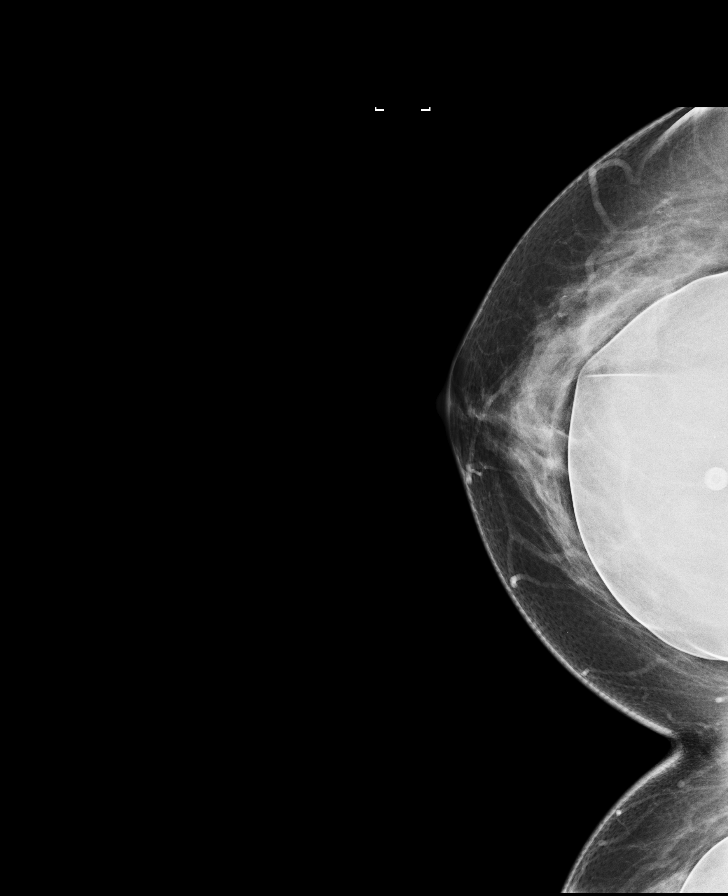

[L MLO]
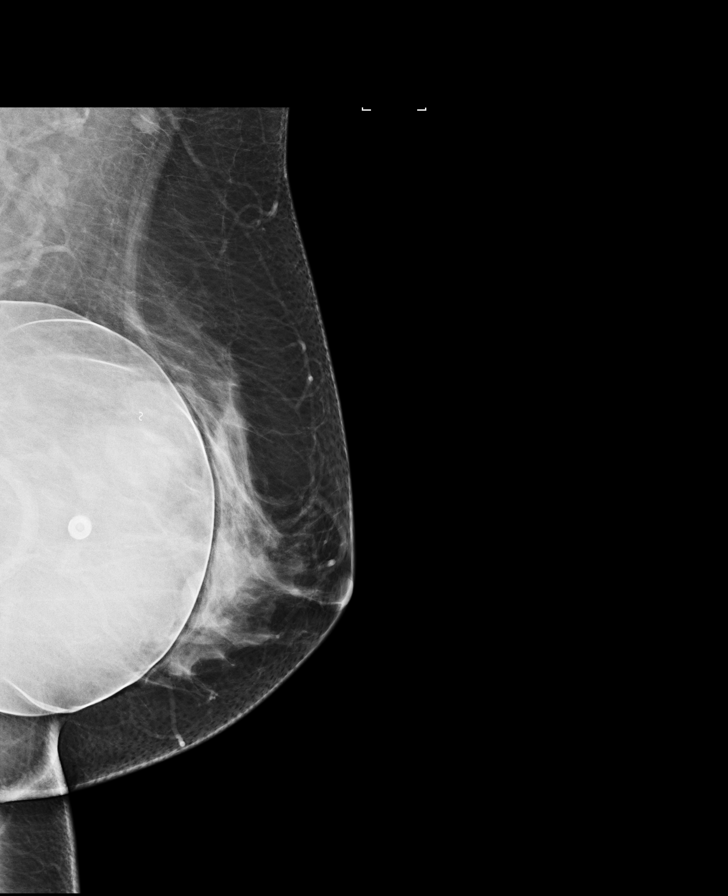

[L CC (2 of 2)]
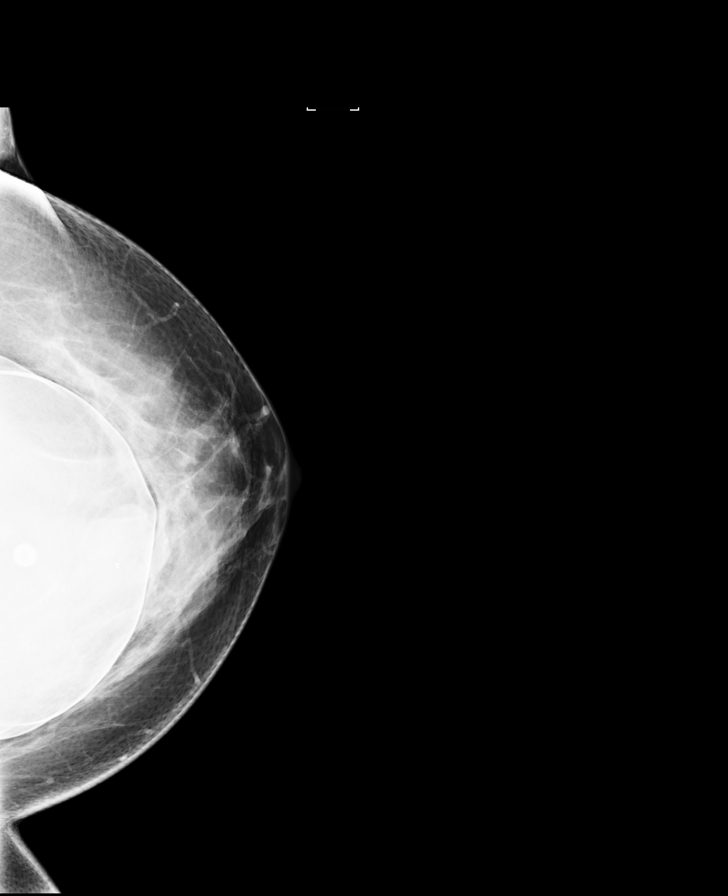

[R MLO]
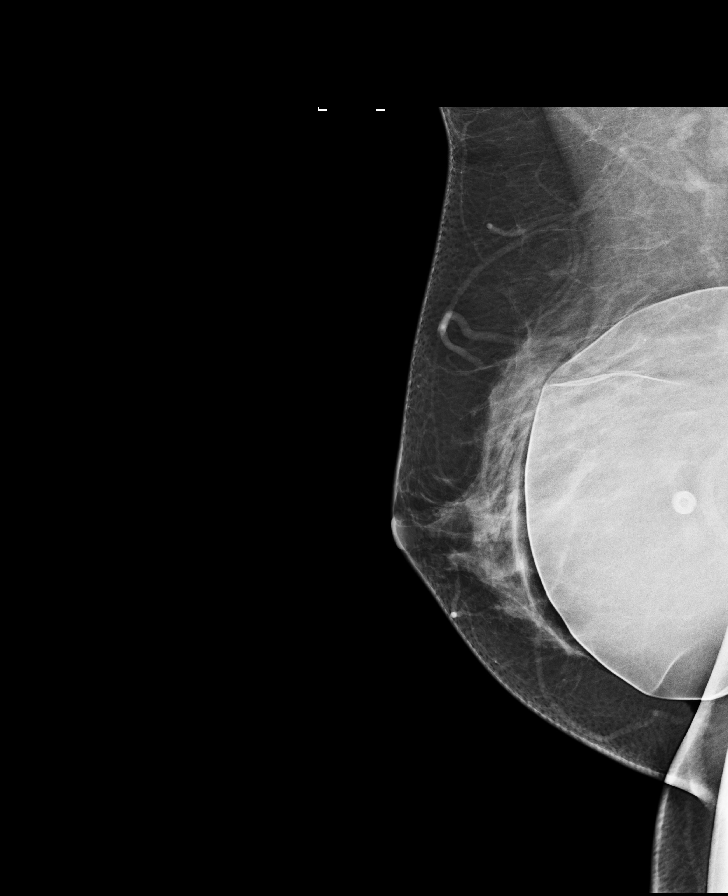

[R MLO synth-2D]
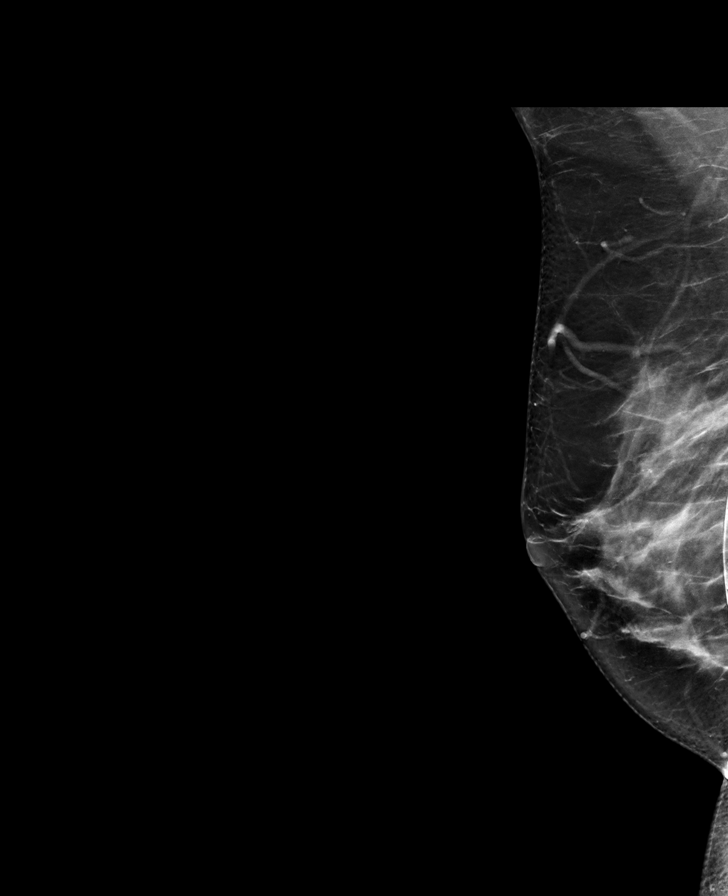

[R CC synth-2D]
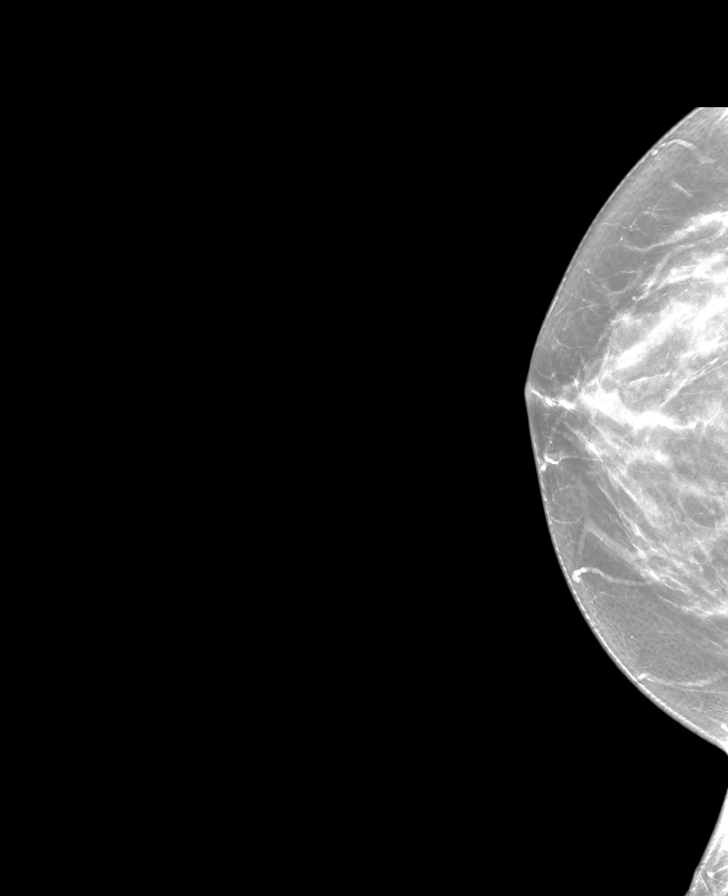

[L CC synth-2D]
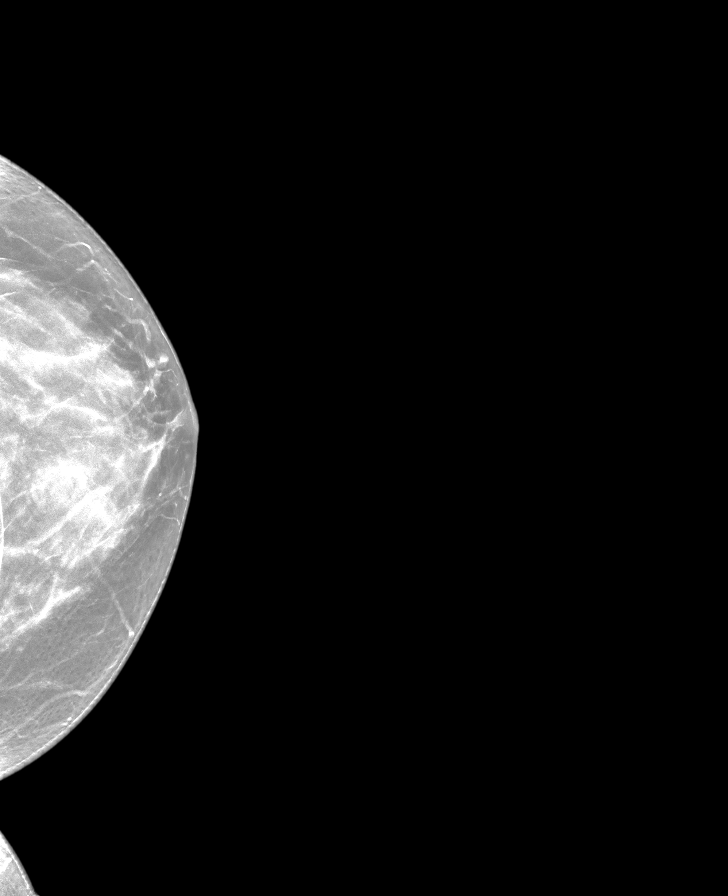

[8 of 29 positions shown; findings below may reference images not displayed]

ACR Breast Density Category c: The breast tissue is heterogeneously
dense, which may obscure small masses.
FINDINGS: The patient has retropectoral implants. There are no findings
suspicious for malignancy.
IMPRESSION: No mammographic evidence of malignancy. A result letter of this
screening mammogram will be mailed directly to the patient.

RECOMMENDATION:
Screening mammogram in one year. (Code:LT-E-7TH)

BI-RADS CATEGORY  1:  Negative.

## 2023-05-21 ENCOUNTER — Other Ambulatory Visit: Payer: Self-pay | Admitting: Internal Medicine

## 2023-05-21 DIAGNOSIS — Z1231 Encounter for screening mammogram for malignant neoplasm of breast: Secondary | ICD-10-CM

## 2023-06-25 ENCOUNTER — Ambulatory Visit
Admission: RE | Admit: 2023-06-25 | Discharge: 2023-06-25 | Disposition: A | Discharge status: Home / Self Care | Payer: 59 | Source: Ambulatory Visit | Attending: Internal Medicine | Admitting: Internal Medicine

## 2023-06-25 DIAGNOSIS — Z1231 Encounter for screening mammogram for malignant neoplasm of breast: Secondary | ICD-10-CM | POA: Insufficient documentation
